# Patient Record
Sex: Female | Born: 1975 | Race: Black or African American | Hispanic: No | Marital: Married | Smoking: Never smoker
Health system: Southern US, Community
[De-identification: ages and names within clinical notes are randomized; demographics above are authoritative.]

## PROBLEM LIST (undated history)

## (undated) DIAGNOSIS — R0602 Shortness of breath: Secondary | ICD-10-CM

## (undated) DIAGNOSIS — R7303 Prediabetes: Secondary | ICD-10-CM

## (undated) DIAGNOSIS — N920 Excessive and frequent menstruation with regular cycle: Secondary | ICD-10-CM

## (undated) DIAGNOSIS — E669 Obesity, unspecified: Secondary | ICD-10-CM

## (undated) DIAGNOSIS — S52502A Unspecified fracture of the lower end of left radius, initial encounter for closed fracture: Secondary | ICD-10-CM

## (undated) DIAGNOSIS — F419 Anxiety disorder, unspecified: Secondary | ICD-10-CM

## (undated) DIAGNOSIS — D649 Anemia, unspecified: Secondary | ICD-10-CM

## (undated) DIAGNOSIS — G4730 Sleep apnea, unspecified: Secondary | ICD-10-CM

## (undated) DIAGNOSIS — E785 Hyperlipidemia, unspecified: Secondary | ICD-10-CM

## (undated) DIAGNOSIS — E0590 Thyrotoxicosis, unspecified without thyrotoxic crisis or storm: Secondary | ICD-10-CM

## (undated) DIAGNOSIS — J3089 Other allergic rhinitis: Secondary | ICD-10-CM

## (undated) HISTORY — DX: Hyperlipidemia, unspecified: E78.5

## (undated) HISTORY — DX: Anemia, unspecified: D64.9

## (undated) HISTORY — DX: Prediabetes: R73.03

## (undated) HISTORY — DX: Other allergic rhinitis: J30.89

## (undated) HISTORY — DX: Obesity, unspecified: E66.9

## (undated) HISTORY — DX: Thyrotoxicosis, unspecified without thyrotoxic crisis or storm: E05.90

## (undated) HISTORY — DX: Excessive and frequent menstruation with regular cycle: N92.0

---

## 2003-06-29 HISTORY — PX: TUBAL LIGATION: SHX77

## 2003-07-13 ENCOUNTER — Inpatient Hospital Stay (HOSPITAL_COMMUNITY): Admission: AD | Admit: 2003-07-13 | Discharge: 2003-07-13 | Payer: Self-pay | Admitting: *Deleted

## 2003-09-09 ENCOUNTER — Ambulatory Visit (HOSPITAL_COMMUNITY): Admission: RE | Admit: 2003-09-09 | Discharge: 2003-09-09 | Payer: Self-pay | Admitting: Obstetrics and Gynecology

## 2004-01-06 ENCOUNTER — Ambulatory Visit (HOSPITAL_COMMUNITY): Admission: AD | Admit: 2004-01-06 | Discharge: 2004-01-06 | Payer: Self-pay | Admitting: Obstetrics and Gynecology

## 2004-01-20 ENCOUNTER — Ambulatory Visit (HOSPITAL_COMMUNITY): Admission: AD | Admit: 2004-01-20 | Discharge: 2004-01-20 | Payer: Self-pay | Admitting: Obstetrics and Gynecology

## 2004-01-22 ENCOUNTER — Inpatient Hospital Stay (HOSPITAL_COMMUNITY): Admission: RE | Admit: 2004-01-22 | Discharge: 2004-01-24 | Payer: Self-pay | Admitting: Obstetrics and Gynecology

## 2005-11-26 ENCOUNTER — Emergency Department (HOSPITAL_COMMUNITY): Admission: EM | Admit: 2005-11-26 | Discharge: 2005-11-26 | Payer: Self-pay | Admitting: Emergency Medicine

## 2007-08-22 ENCOUNTER — Ambulatory Visit: Payer: Self-pay | Admitting: Family Medicine

## 2007-08-25 ENCOUNTER — Ambulatory Visit (HOSPITAL_COMMUNITY): Admission: RE | Admit: 2007-08-25 | Discharge: 2007-08-25 | Payer: Self-pay | Admitting: Family Medicine

## 2007-09-08 ENCOUNTER — Other Ambulatory Visit: Admission: RE | Admit: 2007-09-08 | Discharge: 2007-09-08 | Payer: Self-pay | Admitting: Obstetrics and Gynecology

## 2007-11-10 ENCOUNTER — Ambulatory Visit: Payer: Self-pay | Admitting: Family Medicine

## 2007-11-14 DIAGNOSIS — D509 Iron deficiency anemia, unspecified: Secondary | ICD-10-CM

## 2007-11-14 DIAGNOSIS — E785 Hyperlipidemia, unspecified: Secondary | ICD-10-CM

## 2008-01-12 ENCOUNTER — Ambulatory Visit: Payer: Self-pay | Admitting: Family Medicine

## 2008-04-08 ENCOUNTER — Encounter: Payer: Self-pay | Admitting: Family Medicine

## 2008-04-23 ENCOUNTER — Telehealth: Payer: Self-pay | Admitting: Family Medicine

## 2008-11-13 ENCOUNTER — Ambulatory Visit: Payer: Self-pay | Admitting: Family Medicine

## 2008-11-15 ENCOUNTER — Encounter: Payer: Self-pay | Admitting: Family Medicine

## 2009-11-07 ENCOUNTER — Emergency Department (HOSPITAL_COMMUNITY): Admission: EM | Admit: 2009-11-07 | Discharge: 2009-11-07 | Payer: Self-pay | Admitting: Emergency Medicine

## 2010-07-06 ENCOUNTER — Ambulatory Visit
Admission: RE | Admit: 2010-07-06 | Discharge: 2010-07-06 | Payer: Self-pay | Source: Home / Self Care | Attending: Family Medicine | Admitting: Family Medicine

## 2010-07-06 ENCOUNTER — Encounter: Payer: Self-pay | Admitting: Family Medicine

## 2010-07-06 DIAGNOSIS — F411 Generalized anxiety disorder: Secondary | ICD-10-CM | POA: Insufficient documentation

## 2010-07-30 NOTE — Assessment & Plan Note (Signed)
Summary: ov   Vital Signs:  Patient profile:   35 year old female Menstrual status:  irregular Height:      66 inches Weight:      260.50 pounds BMI:     42.20 O2 Sat:      97 % Pulse rate:   124 / minute Pulse rhythm:   regular Resp:     16 per minute BP sitting:   118 / 80  (left arm) Cuff size:   large  Vitals Entered By: Everitt Amber LPN (July 06, 2010 8:40 AM)  Nutrition Counseling: Patient's BMI is greater than 25 and therefore counseled on weight management options. CC: Follow up visit, has been feeling anxious   CC:  Follow up visit and has been feeling anxious.  History of Present Illness: Reports  that she has been doing well.She is concernedabout weight gain. She also has concerns she may be diabetic.  She wants to resume an apetite suppresant,  and plans to start regular exercise also. Denies recent fever or chills. Denies sinus pressure, nasal congestion , ear pain or sore throat. Denies chest congestion, or cough productive of sputum. Denies chest pain, palpitations, PND, orthopnea or leg swelling. Denies abdominal pain, nausea, vomitting, diarrhea or constipation. Denies change in bowel movements or bloody stool. Denies dysuria , frequency, incontinence or hesitancy. Denies  joint pain, swelling, or reduced mobility. Denies headaches, vertigo, seizures. Denies depression,  or insomnia. Denies  rash, lesions, or itch.     Current Medications (verified): 1)  None  Allergies (verified): 1)  ! Sulfa 2)  ! Flagyl (Metronidazole)  Review of Systems      See HPI General:  Complains of fatigue. Eyes:  Denies blurring and discharge. Psych:  Complains of anxiety. Endo:  Denies excessive hunger and excessive thirst. Heme:  Denies abnormal bruising and bleeding. Allergy:  Denies hives or rash and itching eyes.  Physical Exam  General:  Well-developed,obese iin no acute distress; alert,appropriate and cooperative throughout examination HEENT: No facial  asymmetry,  EOMI, No sinus tenderness, TM's Clear, oropharynx  pink and moist.   Chest: Clear to auscultation bilaterally.  CVS: S1, S2, No murmurs, No S3.   Abd: Soft, Nontender.  MS: Adequate ROM spine, hips, shoulders and knees.  Ext: No edema.   CNS: CN 2-12 intact, power tone and sensation normal throughout.   Skin: Intact, no visible lesions or rashes.  Psych: Good eye contact, normal affect.  Memory intact, not anxious or depressed appearing.    Impression & Recommendations:  Problem # 1:  OBESITY (ICD-278.00) Assessment Deteriorated  Orders: T- Hemoglobin A1C (11914-78295)  Ht: 66 (07/06/2010)   Wt: 260.50 (07/06/2010)   BMI: 42.20 (07/06/2010) therapeutic lifestyle change discussed and encouraged Pt to start phentwermine also  Problem # 2:  ANXIETY STATE, UNSPECIFIED (ICD-300.00) Assessment: Improved reviewed behavioral modification to address anxiety  Complete Medication List: 1)  Phentermine Hcl 37.5 Mg Tabs (Phentermine hcl) .... Take 1 tablet by mouth once a day  Patient Instructions: 1)  Please schedule a follow-up appointment in 2 months. 2)  It is important that you exercise regularly at least 30 minutes 5 times a week. If you develop chest pain, have severe difficulty breathing, or feel very tired , stop exercising immediately and seek medical attention. 3)  You need to lose weight. Consider a lower calorie diet and regular exercise.  4)  HbgA1C prior to visit, ICD-9:  today Prescriptions: PHENTERMINE HCL 37.5 MG TABS (PHENTERMINE HCL) Take 1  tablet by mouth once a day  #30 x 1   Entered and Authorized by:   Syliva Overman MD   Signed by:   Syliva Overman MD on 07/06/2010   Method used:   Printed then faxed to ...       [facility] [address] [facility] (retail)       [address]       [place], [place]  [address]       Ph: [phone]       Fax: [phone]   RxID:   [id number]    Orders Added: 1)  Est. Patient Level IV [40102] 2)   T- Hemoglobin A1C [83036-23375]

## 2010-09-02 ENCOUNTER — Telehealth: Payer: Self-pay | Admitting: Family Medicine

## 2010-09-04 ENCOUNTER — Ambulatory Visit: Payer: Self-pay | Admitting: Family Medicine

## 2010-09-07 ENCOUNTER — Telehealth: Payer: Self-pay | Admitting: Family Medicine

## 2010-09-08 NOTE — Progress Notes (Signed)
Summary: speak with nuse  Phone Note Call from Patient   Summary of Call: needs refill on phentermine. but pt can't come in tomorrow. it will be april [phone] Initial call taken by: Rudene Anda,  September 02, 2010 4:43 PM  Follow-up for Phone Call        PLS ASK IF SHE IS LOSING WEIGHT, LET HER UNDERSTAND CLEARLY SHE NEEDS TO LOSE WEIGHT AND KEEP APRIL APPT, REFILL X 1 PLS  Follow-up by: Syliva Overman MD,  September 02, 2010 4:45 PM  Additional Follow-up for Phone Call Additional follow up Details #1::        returned call, left message Additional Follow-up by: Adella Hare LPN,  September 02, 2010 4:48 PM    Additional Follow-up for Phone Call Additional follow up Details #2::    called patient, left message Follow-up by: Adella Hare LPN,  September 03, 2010 9:56 AM

## 2010-09-15 LAB — URINALYSIS, ROUTINE W REFLEX MICROSCOPIC
Bilirubin Urine: NEGATIVE
Glucose, UA: NEGATIVE mg/dL
Ketones, ur: NEGATIVE mg/dL
Specific Gravity, Urine: 1.015 (ref 1.005–1.030)
pH: 5.5 (ref 5.0–8.0)

## 2010-09-15 LAB — URINE CULTURE

## 2010-09-15 LAB — URINE MICROSCOPIC-ADD ON

## 2010-09-15 NOTE — Progress Notes (Signed)
Summary: medicine  Phone Note Call from Patient   Summary of Call: patient wants you fill her diet pill today please Initial call taken by: Lind Guest,  September 07, 2010 1:14 PM  Follow-up for Phone Call        patient states she is losing but very little and agrees to keep appt next month Follow-up by: Adella Hare LPN,  September 07, 2010 1:25 PM    Prescriptions: PHENTERMINE HCL 37.5 MG TABS (PHENTERMINE HCL) Take 1 tablet by mouth once a day  #30 x 0   Entered by:   Adella Hare LPN   Authorized by:   Syliva Overman MD   Signed by:   Adella Hare LPN on 40/34/7425   Method used:   Printed then faxed to ...       [facility] [facility] (retail)       [address]       [place], [place]  [address]       Ph: [phone]       Fax: [phone]   RxID:   [id number]

## 2010-10-15 ENCOUNTER — Encounter: Payer: Self-pay | Admitting: Family Medicine

## 2010-10-16 ENCOUNTER — Encounter: Payer: Self-pay | Admitting: Family Medicine

## 2010-10-19 ENCOUNTER — Encounter: Payer: Self-pay | Admitting: Family Medicine

## 2010-10-19 ENCOUNTER — Ambulatory Visit (INDEPENDENT_AMBULATORY_CARE_PROVIDER_SITE_OTHER): Payer: PRIVATE HEALTH INSURANCE | Admitting: Family Medicine

## 2010-10-19 VITALS — BP 124/74 | HR 103 | Resp 16 | Ht 65.0 in | Wt 262.8 lb

## 2010-10-19 DIAGNOSIS — R7301 Impaired fasting glucose: Secondary | ICD-10-CM

## 2010-10-19 DIAGNOSIS — E785 Hyperlipidemia, unspecified: Secondary | ICD-10-CM

## 2010-10-19 DIAGNOSIS — R5381 Other malaise: Secondary | ICD-10-CM

## 2010-10-19 DIAGNOSIS — D649 Anemia, unspecified: Secondary | ICD-10-CM

## 2010-10-19 DIAGNOSIS — E669 Obesity, unspecified: Secondary | ICD-10-CM

## 2010-10-19 DIAGNOSIS — F411 Generalized anxiety disorder: Secondary | ICD-10-CM

## 2010-10-19 DIAGNOSIS — J309 Allergic rhinitis, unspecified: Secondary | ICD-10-CM

## 2010-10-19 DIAGNOSIS — R5383 Other fatigue: Secondary | ICD-10-CM

## 2010-10-19 MED ORDER — PHENTERMINE HCL 37.5 MG PO TABS: 37.5000 mg | ORAL_TABLET | Freq: Every day | ORAL | Status: DC

## 2010-10-19 MED ORDER — MONTELUKAST SODIUM 10 MG PO TABS: 10.0000 mg | ORAL_TABLET | Freq: Every day | ORAL | Status: DC

## 2010-10-19 NOTE — Patient Instructions (Signed)
F/u in 4 months.  It is important that you exercise regularly at least 30 minutes 5 times a week. If you develop chest pain, have severe difficulty breathing, or feel very tired, stop exercising immediately and seek medical attention    A healthy diet is rich in fruit, vegetables and whole grains. Poultry fish, nuts and beans are a healthy choice for protein rather then red meat. A low sodium diet and drinking 64 ounces of water daily is generally recommended. Oils and sweet should be limited. Carbohydrates especially for those who are diabetic or overweight, should be limited to 34-45 gram per meal. It is important to eat on a regular schedule, at least 3 times daily. Snacks should be primarily fruits, vegetables or nuts.   Fasting lipid, blood sugar and HBA1c also iron and HB as soon as possible  You will get a 1500 cal diet sheet. One multivitamin  Once daily

## 2010-10-20 ENCOUNTER — Telehealth: Payer: Self-pay | Admitting: Family Medicine

## 2010-10-30 ENCOUNTER — Other Ambulatory Visit: Payer: Self-pay | Admitting: Family Medicine

## 2010-10-30 LAB — BASIC METABOLIC PANEL
BUN: 14 mg/dL (ref 6–23)
Chloride: 102 mEq/L (ref 96–112)
Potassium: 4.1 mEq/L (ref 3.5–5.3)
Sodium: 137 mEq/L (ref 135–145)

## 2010-10-30 LAB — LIPID PANEL
HDL: 40 mg/dL (ref >39)
Total CHOL/HDL Ratio: 3.9 Ratio
Triglycerides: 49 mg/dL (ref <150)

## 2010-10-30 LAB — TSH: TSH: 0.76 u[IU]/mL (ref 0.350–4.500)

## 2010-10-30 LAB — IRON: Iron: 53 ug/dL (ref 42–145)

## 2010-10-30 LAB — GLUCOSE, RANDOM: Glucose, Bld: 100 mg/dL — ABNORMAL HIGH (ref 70–99)

## 2010-11-02 ENCOUNTER — Telehealth: Payer: Self-pay | Admitting: Family Medicine

## 2010-11-02 MED ORDER — METFORMIN HCL 500 MG PO TABS: 500.0000 mg | ORAL_TABLET | Freq: Two times a day (BID) | ORAL | Status: DC

## 2010-11-02 NOTE — Telephone Encounter (Signed)
Spoke with patient.

## 2010-11-02 NOTE — Progress Notes (Signed)
Patient aware and coming this week for diabetic teaching

## 2010-11-04 NOTE — Assessment & Plan Note (Signed)
Deteriorated. Patient re-educated about  the importance of commitment to a  minimum of 150 minutes of exercise per week. The importance of healthy food choices with portion control discussed. Encouraged to start a food diary, count calories and to consider  joining a support group. Sample diet sheets offered. Goals set by the patient for the next several months.    

## 2010-11-04 NOTE — Progress Notes (Signed)
  Subjective:    Patient ID: Doris Romero, female    DOB: 1976-01-17, 35 y.o.   MRN: [id number]  HPI Pt  In with a primary c/o excessive weight gain requesting assistance with phentermine, which has  Helped in the past. She had an elevated HBA1c several months ago, and has diabetics in her family. She denies excessive thirst, polyuria, blurred vision or vision changes. She reports excessive stress primarily from her clases, does not want medication, and plans to become more organized.    Review of Systems Denies recent fever or chills. Denies sinus pressure, nasal congestion, ear pain or sore throat. Denies chest congestion, productive cough or wheezing. Denies chest pains, palpitations, paroxysmal nocturnal dyspnea, orthopnea and leg swelling Denies abdominal pain, nausea, vomiting,diarrhea or constipation.  t. Denies dysuria, frequency, hesitancy or incontinence. Denies joint pain, swelling and limitation in mobility. Denies skin break down or rash.        Objective:   Physical Exam Patient alert and oriented and in no Cardiopulmonary distress.  HEENT: No facial asymmetry, EOMI, no sinus tenderness, .  Neck supple no adenopathy.  Chest: Clear to auscultation bilaterally.  CVS: S1, S2 no murmurs, no S3.  ABD: Soft non tender. Bowel sounds normal.  Ext: No edema  MS: Adequate ROM spine, shoulders, hips and knees.  Skin: Intact, no ulcerations or rash noted.  Psych: Good eye contact, normal affect. Memory intact not anxious or depressed appearing.  CNS: CN 2-12 intact, power, tone and sensation normal throughout.        Assessment & Plan:

## 2010-11-04 NOTE — Assessment & Plan Note (Signed)
Pt ventilated for approx 7 minutes, coping strategies for anxiety discussed

## 2010-11-13 NOTE — Op Note (Signed)
NAME:  Doris Romero, Doris Romero NO.:  [id number]   MEDICAL RECORD NO.:  [id number]                   PATIENT TYPE:  INP   LOCATION:  A417                                 FACILITY:  [facility]   PHYSICIAN:  Tilda Burrow, M.D.              DATE OF BIRTH:  13-Feb-1976   DATE OF PROCEDURE:  DATE OF DISCHARGE:                                 OPERATIVE REPORT   PREOPERATIVE DIAGNOSES:  1. Pregnancy at 39 weeks.  2. Repeat cesarean section, not for trial of labor.  3. Desire for elective permanent sterilization.  4. Unsatisfactory old cesarean section scar.   POSTOPERATIVE DIAGNOSES:  1. Pregnancy at 39 weeks.  2. Repeat cesarean section, not for trial of labor.  3. Desire for elective permanent sterilization.  4. Unsatisfactory old cesarean section scar.   PROCEDURE:  Repeat cesarean section, bilateral tubal ligation, wide excision  of cicatrix.   SURGEON:  Tilda Burrow, M.D.   ASSISTANT:  Cecile Sheerer, R.N.   ANESTHESIA:  Spinal.  Glynn Octave, C.R.N.A.   COMPLICATIONS:  None.   FINDINGS:  8 pound 1.5 ounce female infant.  Apgars 9 and 9.  Clear amniotic  fluid.  Thin lower uterine segment.  Normal-appearing tubes bilaterally.   DESCRIPTION OF PROCEDURE:  The patient was taken to the operating room and  prepped and draped in the usual fashion for lower abdominal surgery.   The Pfannenstiel-type incision was widely excised, removing the old cicatrix  with approximately 10 to 12 cm of skin and underlying fatty tissue.  The  fascia was opened in the midline.  There was a lot of fibrosis in this area.  The muscles could be easily peeled off of the underside of the fascia  despite the tendency towards adhesions.  Midline entry of the peritoneal  cavity was without difficulty, and the bladder flap was inspected.  She had  a very low bladder location on the uterus, so we did not have to perform  bladder flap.  A transverse uterine incision was made through  the thin lower  uterine segment and extended laterally using index finger traction.  The  amniotic fluid was clear.  A loop of cord protruded through the incision,  slipping passed the head immediately.  The head was guided into the incision  and fundal pressure applied while the vertex was guided through the incision  manually.  The patient tolerated the fundal pressure well.  We were able to  deliver the infant, clamp the cord, and pass the infant to the awaiting  pediatrician, Dr. Francoise Schaumann. Halm.  Apgars of 9 and 9 were assigned.  Weight  was subsequently reported as 8 pounds 1.5 ounces.  See his notes for further  details on the baby.   Cord blood gas was obtained, and the results are pending elsewhere.  The  placenta delivered intact, Schultze presentation, with no membrane remnants  identifiable.  Uterine  irrigation with antibiotic-containing solution was  followed by a single layer of running locking closure of the uterine  incision, with successful closure with good hemostasis.   Tubal ligation was performed by identifying each tube to its fimbriated end,  elevating a midsegment knuckle of tube, doubly ligating around the  incarcerated knuckle of tube, and then excising the incarcerated specimen.  These were sent to pathology.   The peritoneal cavity was closed in the midline with 2-0 chromic.  The  fascia was then closed transversely with 0 Vicryl after the rectus muscles  had been pulled into the midline.   Prior to the fascial closure, we trimmed an approximately 1-cm portion of  the lax fascial tissue to result in better tissue edge approximation without  overlap.  We also trimmed an additional 3 cm of skin so that the skin edge  approximations were optimized.  The subcutaneous tissues were reapproximated  with interrupted 2-0 plain.  A JP drain was placed and allowed to exit  through a separate stab incision on the left side.  Staple closure of the  skin completed the  procedure.  Estimated blood loss was 600 cc, and there  was satisfactory cosmetic result.   The patient went to the recovery room in good condition.      ___________________________________________                                            Tilda Burrow, M.D.   JVF/MEDQ  D:  01/22/2004  T:  01/22/2004  Job:  [id number]   cc:   Francoise Schaumann. Halm, D.O.  [address], [address]  [place]  [place] [address]  Fax: [phone]

## 2010-11-13 NOTE — Discharge Summary (Signed)
NAME:  Doris Romero, Doris Romero NO.:  [id number]   MEDICAL RECORD NO.:  [id number]                   PATIENT TYPE:  INP   LOCATION:  A417                                 FACILITY:  [facility]   PHYSICIAN:  Tilda Burrow, M.D.              DATE OF BIRTH:  [birth year]   DATE OF ADMISSION:  01/22/2004  DATE OF DISCHARGE:  01/24/2004                                 DISCHARGE SUMMARY   ADMISSION DIAGNOSES:  1. Pregnancy, [redacted] weeks gestation.  2. Prior Cesarean section, not for trial of labor.  3. Elective permanent sterilization.   DISCHARGE DIAGNOSES:  1. Pregnancy, 39 weeks, delivered.  2. Elective sterilization.   PROCEDURE:  On 01/22/2004, repeat low transverse cervical Cesarean section  and bilateral partial salpingectomy.   DISCHARGE MEDICATIONS:  1. Darvocet-N 100, two q.4h. p.r.n. pain.  2. __________ Larose Hires one p.o. daily x30 days.   FOLLOW UP:  In one week for staple removal.   HOSPITAL COURSE:  This 35 year old gravida 5, para 1, AB 3 at 19 weeks was  admitted for repeat Cesarean section and tubal ligation after pregnancy  followed through [facility] and documented in the prenatal records  and __________ history.  The patient underwent cesarean section, delivering  a female infant with Apgars of 9 and 9 with 600 cc estimated blood loss.  Tubal ligation was performed as well as wide excision of cicatrix.  The  patient's postoperative course was uneventful, with minimal pain  experienced.  Hemoglobin 11, hematocrit 32 postoperatively.  The patient was  discharged on postoperative day #2, for follow up in one week for staple  removal and four weeks for incision check.     ___________________________________________                                         Tilda Burrow, M.D.   JVF/MEDQ  D:  02/20/2004  T:  02/21/2004  Job:  [id number]

## 2010-11-13 NOTE — H&P (Signed)
NAME:  Doris Romero, Doris Romero NO.:  [id number]   MEDICAL RECORD NO.:  [id number]                   PATIENT TYPE:  INP   LOCATION:  A417                                 FACILITY:  [facility]   PHYSICIAN:  Tilda Burrow, M.D.              DATE OF BIRTH:  Nov 14, 1975   DATE OF ADMISSION:  01/22/2004  DATE OF DISCHARGE:                                HISTORY & PHYSICAL   Doris Romero is a 35 year old, gravida 5, para 1, AB 3, at 39 weeks by consensus  criteria, admitted for repeat Cesarean section and tubal ligation.  Her  prenatal course has been uncomplicated through our office.  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  She is currently having no symptoms.  She, additionally, has requested  elective permanent sterilization with tubal ligation planned with general  failure rates of 1/100 known to patient.   PAST MEDICAL HISTORY:  Benign.   PAST SURGICAL HISTORY:  C-section times one.   ALLERGIES:  ANTIBIOTICS, only.  See hand-written HPI.   PHYSICAL EXAMINATION:  GENERAL:  This is a large-framed, African-American  female with a term sized fetus, vertex presentation, with exam specific for  deep crease at the site of the old C-section scar, which the patient desires  to have excised at the time of the C-section.  We plan a wide excision of  cicatrix to improve cosmetic appearance, as per patient request.   See prenatal record for labs.   PLAN:  Repeat Cesarean section, tubal ligation on January 22, 2004.     ___________________________________________                                         Tilda Burrow, M.D.   JVF/MEDQ  D:  01/22/2004  T:  01/22/2004  Job:  [id number]

## 2011-01-26 ENCOUNTER — Ambulatory Visit: Payer: PRIVATE HEALTH INSURANCE | Admitting: Family Medicine

## 2011-09-06 ENCOUNTER — Ambulatory Visit (INDEPENDENT_AMBULATORY_CARE_PROVIDER_SITE_OTHER): Payer: PRIVATE HEALTH INSURANCE | Admitting: Family Medicine

## 2011-09-06 ENCOUNTER — Encounter: Payer: Self-pay | Admitting: Family Medicine

## 2011-09-06 ENCOUNTER — Telehealth: Payer: Self-pay | Admitting: Family Medicine

## 2011-09-06 VITALS — BP 110/70 | HR 95 | Resp 16 | Ht 65.0 in | Wt 255.1 lb

## 2011-09-06 DIAGNOSIS — M25551 Pain in right hip: Secondary | ICD-10-CM | POA: Insufficient documentation

## 2011-09-06 DIAGNOSIS — M25519 Pain in unspecified shoulder: Secondary | ICD-10-CM

## 2011-09-06 DIAGNOSIS — F411 Generalized anxiety disorder: Secondary | ICD-10-CM

## 2011-09-06 DIAGNOSIS — R7309 Other abnormal glucose: Secondary | ICD-10-CM

## 2011-09-06 DIAGNOSIS — M25552 Pain in left hip: Secondary | ICD-10-CM

## 2011-09-06 DIAGNOSIS — M25512 Pain in left shoulder: Secondary | ICD-10-CM

## 2011-09-06 DIAGNOSIS — R7302 Impaired glucose tolerance (oral): Secondary | ICD-10-CM

## 2011-09-06 DIAGNOSIS — M25559 Pain in unspecified hip: Secondary | ICD-10-CM

## 2011-09-06 DIAGNOSIS — E669 Obesity, unspecified: Secondary | ICD-10-CM

## 2011-09-06 DIAGNOSIS — Z862 Personal history of diseases of the blood and blood-forming organs and certain disorders involving the immune mechanism: Secondary | ICD-10-CM

## 2011-09-06 MED ORDER — PHENTERMINE HCL 37.5 MG PO TBDP: 37.5000 mg | ORAL_TABLET | Freq: Every day | ORAL | Status: DC

## 2011-09-06 NOTE — Assessment & Plan Note (Signed)
1 year h/o bilateral shoulder pain waking pt

## 2011-09-06 NOTE — Patient Instructions (Addendum)
F/u in 2 month.  You are referred to Dr Dion Saucier about progressive shoulder and hip pain.  HBA1C ,chem7, cbc and iron today please  It is important that you exercise regularly at least 30 minutes 5 times a week. If you develop chest pain, have severe difficulty breathing, or feel very tired, stop exercising immediately and seek medical attention    A healthy diet is rich in fruit, vegetables and whole grains. Poultry fish, nuts and beans are a healthy choice for protein rather then red meat. A low sodium diet and drinking 64 ounces of water daily is generally recommended. Oils and sweet should be limited. Carbohydrates especially for those who are diabetic or overweight, should be limited to 30-45 gram per meal. It is important to eat on a regular schedule, at least 3 times daily. Snacks should be primarily fruits, vegetables or nuts.

## 2011-09-06 NOTE — Telephone Encounter (Signed)
Faxed in to [facility]

## 2011-09-06 NOTE — Assessment & Plan Note (Signed)
3 month h/o bilateral hip pai n which is worsening

## 2011-09-06 NOTE — Assessment & Plan Note (Signed)
Deteriorated. Patient re-educated about  the importance of commitment to a  minimum of 150 minutes of exercise per week. The importance of healthy food choices with portion control discussed. Encouraged to start a food diary, count calories and to consider  joining a support group. Sample diet sheets offered. Goals set by the patient for the next several months.    

## 2011-09-07 LAB — CBC WITH DIFFERENTIAL/PLATELET
Basophils Relative: 0 % (ref 0–1)
Eosinophils Absolute: 0.1 10*3/uL (ref 0.0–0.7)
Eosinophils Relative: 4 % (ref 0–5)
HCT: 37.2 % (ref 36.0–46.0)
Hemoglobin: 11.5 g/dL — ABNORMAL LOW (ref 12.0–15.0)
Lymphs Abs: 1.7 10*3/uL (ref 0.7–4.0)
MCH: 24.8 pg — ABNORMAL LOW (ref 26.0–34.0)
MCHC: 30.9 g/dL (ref 30.0–36.0)
MCV: 80.3 fL (ref 78.0–100.0)
Monocytes Absolute: 0.5 10*3/uL (ref 0.1–1.0)
Monocytes Relative: 13 % — ABNORMAL HIGH (ref 3–12)
Neutrophils Relative %: 38 % — ABNORMAL LOW (ref 43–77)
RBC: 4.63 MIL/uL (ref 3.87–5.11)

## 2011-09-07 LAB — BASIC METABOLIC PANEL
CO2: 25 mEq/L (ref 19–32)
Chloride: 102 mEq/L (ref 96–112)
Creat: 0.49 mg/dL — ABNORMAL LOW (ref 0.50–1.10)
Potassium: 4.2 mEq/L (ref 3.5–5.3)
Sodium: 136 mEq/L (ref 135–145)

## 2011-09-07 LAB — HEMOGLOBIN A1C
Hgb A1c MFr Bld: 6.3 % — ABNORMAL HIGH (ref <5.7)
Mean Plasma Glucose: 134 mg/dL — ABNORMAL HIGH (ref <117)

## 2011-09-07 LAB — IRON: Iron: 88 ug/dL (ref 42–145)

## 2011-09-10 ENCOUNTER — Telehealth: Payer: Self-pay | Admitting: Family Medicine

## 2011-09-14 NOTE — Telephone Encounter (Signed)
Called patient and left message for them to return call at the office   

## 2011-09-15 DIAGNOSIS — R7302 Impaired glucose tolerance (oral): Secondary | ICD-10-CM | POA: Insufficient documentation

## 2011-09-15 NOTE — Assessment & Plan Note (Signed)
ELevated HBA1c last year, pt will have this repeated, metformin a good drug for her , however will need to readdress this at f/u

## 2011-09-15 NOTE — Assessment & Plan Note (Signed)
Increased due to multiple personal stressors

## 2011-09-15 NOTE — Telephone Encounter (Signed)
Pt aware.

## 2011-09-15 NOTE — Progress Notes (Signed)
  Subjective:    Patient ID: Doris Romero, female    DOB: 1975-10-30, 36 y.o.   MRN: [id number]  HPI The PT is here for follow up and re-evaluation of chronic medical conditions, medication management and review of any available recent lab and radiology data.  C/o progressive and disabling bilateral hip pain, right greater than left, sometimes disturbing her sleep, also bilateral shoulder pain, both have progressed over the last 3 to 4 months. No fall or trauma history. 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 Has iron deficiency and wants this checked also    Review of Systems See HPI Denies recent fever or chills. Denies sinus pressure, nasal congestion, ear pain or sore throat. Denies chest congestion, productive cough or wheezing. Denies chest pains, palpitations and leg swelling Denies abdominal pain, nausea, vomiting,diarrhea or constipation.   Denies dysuria, frequency, hesitancy or incontinence.  Denies headaches, seizures, numbness, or tingling.  Denies skin break down or rash.        Objective:   Physical Exam Patient alert and oriented and in no cardiopulmonary distress.  HEENT: No facial asymmetry, EOMI, no sinus tenderness,  oropharynx pink and moist.  Neck supple no adenopathy.  Chest: Clear to auscultation bilaterally.  CVS: S1, S2 no murmurs, no S3.  ABD: Soft non tender. Bowel sounds normal.  Ext: No edema  MS: Adequate ROM spine,  and knees.Mildly decreased in hips and shoulders  Skin: Intact, no ulcerations or rash noted.  Psych: Good eye contact, normal affect. Memory intact anxious  not depressed appearing.  CNS: CN 2-12 intact, power, tone and sensation normal throughout.          Assessment & Plan:

## 2011-09-15 NOTE — Assessment & Plan Note (Signed)
reases with updated labwork

## 2012-06-05 ENCOUNTER — Encounter: Payer: Self-pay | Admitting: Family Medicine

## 2012-06-05 ENCOUNTER — Ambulatory Visit (INDEPENDENT_AMBULATORY_CARE_PROVIDER_SITE_OTHER): Payer: BC Managed Care – PPO | Admitting: Family Medicine

## 2012-06-05 ENCOUNTER — Other Ambulatory Visit: Payer: Self-pay | Admitting: Family Medicine

## 2012-06-05 VITALS — BP 130/74 | HR 104 | Resp 18 | Ht 65.0 in | Wt 240.0 lb

## 2012-06-05 DIAGNOSIS — E785 Hyperlipidemia, unspecified: Secondary | ICD-10-CM

## 2012-06-05 DIAGNOSIS — R7309 Other abnormal glucose: Secondary | ICD-10-CM

## 2012-06-05 DIAGNOSIS — E669 Obesity, unspecified: Secondary | ICD-10-CM

## 2012-06-05 DIAGNOSIS — E0590 Thyrotoxicosis, unspecified without thyrotoxic crisis or storm: Secondary | ICD-10-CM

## 2012-06-05 DIAGNOSIS — R7302 Impaired glucose tolerance (oral): Secondary | ICD-10-CM

## 2012-06-05 MED ORDER — PHENTERMINE HCL 37.5 MG PO TBDP: 1.0000 | ORAL_TABLET | Freq: Every day | ORAL | Status: DC

## 2012-06-05 NOTE — Patient Instructions (Addendum)
F/u in 4 month  Weight loss goal of 4 to 5 pounds per month  HBA1C, chem 7, TSH, cbc today  CONGRATS on weight ,loss, keep it up.  Start HALF phentermine tablet daily (script says one)  Please start daily, physical activity  For at least 30 minutes for health, keep dancing   CONGRATS on copmpleting your course

## 2012-06-06 LAB — BASIC METABOLIC PANEL
BUN: 11 mg/dL (ref 6–23)
CO2: 26 mEq/L (ref 19–32)
Chloride: 105 mEq/L (ref 96–112)
Potassium: 4.1 mEq/L (ref 3.5–5.3)

## 2012-06-06 LAB — CBC
HCT: 32.8 % — ABNORMAL LOW (ref 36.0–46.0)
MCV: 74 fL — ABNORMAL LOW (ref 78.0–100.0)
RBC: 4.43 MIL/uL (ref 3.87–5.11)
RDW: 14.7 % (ref 11.5–15.5)
WBC: 5.2 10*3/uL (ref 4.0–10.5)

## 2012-06-06 LAB — TSH: TSH: <0.008 u[IU]/mL — ABNORMAL LOW (ref 0.350–4.500)

## 2012-06-07 ENCOUNTER — Other Ambulatory Visit: Payer: Self-pay | Admitting: Family Medicine

## 2012-06-07 DIAGNOSIS — E0590 Thyrotoxicosis, unspecified without thyrotoxic crisis or storm: Secondary | ICD-10-CM

## 2012-06-07 DIAGNOSIS — R7989 Other specified abnormal findings of blood chemistry: Secondary | ICD-10-CM

## 2012-06-07 LAB — ANEMIA PANEL
%SAT: 27 % (ref 20–55)
ABS Retic: 40.3 10*3/uL (ref 19.0–186.0)
RBC.: 4.48 MIL/uL (ref 3.87–5.11)
TIBC: 341 ug/dL (ref 250–470)
UIBC: 250 ug/dL (ref 125–400)

## 2012-06-13 ENCOUNTER — Ambulatory Visit (HOSPITAL_COMMUNITY): Payer: BC Managed Care – PPO

## 2012-06-14 ENCOUNTER — Ambulatory Visit (HOSPITAL_COMMUNITY)
Admission: RE | Admit: 2012-06-14 | Discharge: 2012-06-14 | Disposition: A | Discharge status: Home / Self Care | Payer: BC Managed Care – PPO | Source: Ambulatory Visit | Attending: Family Medicine | Admitting: Family Medicine

## 2012-06-14 DIAGNOSIS — R7989 Other specified abnormal findings of blood chemistry: Secondary | ICD-10-CM

## 2012-06-14 DIAGNOSIS — E0590 Thyrotoxicosis, unspecified without thyrotoxic crisis or storm: Secondary | ICD-10-CM

## 2012-06-14 DIAGNOSIS — E041 Nontoxic single thyroid nodule: Secondary | ICD-10-CM | POA: Insufficient documentation

## 2012-06-18 DIAGNOSIS — E890 Postprocedural hypothyroidism: Secondary | ICD-10-CM | POA: Insufficient documentation

## 2012-06-18 NOTE — Assessment & Plan Note (Signed)
Deterioration in TSH value, with elevated free hormone levels. Essentially unintentional weight o, and hyperactivity. Endo referral for further management

## 2012-06-18 NOTE — Assessment & Plan Note (Signed)
Improved. Pt applauded on succesful weight loss through lifestyle change, and encouraged to continue same. Weight loss goal set for the next several months.  

## 2012-06-18 NOTE — Assessment & Plan Note (Signed)
Mildly elevated LDL Hyperlipidemia:Low fat diet discussed and encouraged.  No meds at this visit

## 2012-06-18 NOTE — Progress Notes (Signed)
  Subjective:    Patient ID: Doris Romero, female    DOB: 11-30-1975, 36 y.o.   MRN: [id number]  HPI The PT is here for follow up and re-evaluation of chronic medical conditions, medication management and review of any available recent lab and radiology data. outstanding will be done on day of visit Preventive health is updated, specifically  Cancer screening and Immunization.     Here to have update on labs , and re evaluation of thyroid status as well as prediabetic status. Labs will need to be reviewed after visit as blood drawn on day of visit. Unfortunately , lost her Mom to ill health since last visit.  Now completing RN program, happy and relieved about this Has worked inconsistently, if at all on weight loss and is therefore surprised about same, requesting phentermine for additional help. Will hold on med pending thyroid function result    Review of Systems See HPI Denies recent fever or chills. Denies sinus pressure, nasal congestion, ear pain or sore throat. Denies chest congestion, productive cough or wheezing. Denies chest pains, palpitations and leg swelling Denies abdominal pain, nausea, vomiting,diarrhea or constipation.   Denies dysuria, frequency, hesitancy or incontinence. Denies joint pain, swelling and limitation in mobility. Denies headaches, seizures, numbness, or tingling. Denies depression, anxiety or insomnia. Denies skin break down or rash.        Objective:   Physical Exam  Patient alert and oriented and in no cardiopulmonary distress.  HEENT: No facial asymmetry, EOMI, no sinus tenderness,  oropharynx pink and moist.  Neck supple no adenopathy.  Chest: Clear to auscultation bilaterally.  CVS: S1, S2 no murmurs, no S3.  ABD: Soft non tender. Bowel sounds normal.  Ext: No edema  MS: Adequate ROM spine, shoulders, hips and knees.  Skin: Intact, no ulcerations or rash noted.  Psych: Good eye contact, normal affect. Memory intact not  anxious or depressed appearing.  CNS: CN 2-12 intact, power, tone and sensation normal throughout.       Assessment & Plan:

## 2012-06-18 NOTE — Assessment & Plan Note (Signed)
Mild improvement with weight loss, though still abn Patient educated about the importance of limiting  Carbohydrate intake , the need to commit to daily physical activity for a minimum of 30 minutes , and to commit weight loss. The fact that changes in all these areas will reduce or eliminate all together the development of diabetes is stressed.

## 2012-08-16 ENCOUNTER — Other Ambulatory Visit (HOSPITAL_COMMUNITY): Payer: Self-pay | Admitting: "Endocrinology

## 2012-08-16 DIAGNOSIS — E0590 Thyrotoxicosis, unspecified without thyrotoxic crisis or storm: Secondary | ICD-10-CM

## 2012-08-17 ENCOUNTER — Encounter (HOSPITAL_COMMUNITY)
Admission: RE | Admit: 2012-08-17 | Discharge: 2012-08-17 | Disposition: A | Discharge status: Home / Self Care | Payer: BC Managed Care – PPO | Source: Ambulatory Visit | Attending: "Endocrinology | Admitting: "Endocrinology

## 2012-08-17 ENCOUNTER — Encounter (HOSPITAL_COMMUNITY): Payer: Self-pay

## 2012-08-17 DIAGNOSIS — E0590 Thyrotoxicosis, unspecified without thyrotoxic crisis or storm: Secondary | ICD-10-CM | POA: Insufficient documentation

## 2012-08-17 DIAGNOSIS — E079 Disorder of thyroid, unspecified: Secondary | ICD-10-CM | POA: Insufficient documentation

## 2012-08-17 MED ORDER — SODIUM IODIDE I 131 CAPSULE
15.0000 | Freq: Once | INTRAVENOUS | Status: AC | PRN
  Administered 2012-08-17: 15 via ORAL

## 2012-08-18 ENCOUNTER — Encounter (HOSPITAL_COMMUNITY)
Admission: RE | Admit: 2012-08-18 | Discharge: 2012-08-18 | Disposition: A | Discharge status: Home / Self Care | Payer: BC Managed Care – PPO | Source: Ambulatory Visit | Attending: "Endocrinology | Admitting: "Endocrinology

## 2012-08-18 ENCOUNTER — Encounter (HOSPITAL_COMMUNITY): Payer: Self-pay

## 2012-08-18 MED ORDER — SODIUM PERTECHNETATE TC 99M INJECTION
10.0000 | Freq: Once | INTRAVENOUS | Status: AC | PRN
  Administered 2012-08-18: 10 via INTRAVENOUS

## 2012-08-23 ENCOUNTER — Other Ambulatory Visit (HOSPITAL_COMMUNITY): Payer: Self-pay | Admitting: "Endocrinology

## 2012-08-28 ENCOUNTER — Ambulatory Visit (HOSPITAL_COMMUNITY): Payer: BC Managed Care – PPO

## 2012-08-28 ENCOUNTER — Encounter (HOSPITAL_COMMUNITY): Payer: BC Managed Care – PPO

## 2012-08-29 ENCOUNTER — Encounter (HOSPITAL_COMMUNITY): Payer: Self-pay

## 2012-08-29 ENCOUNTER — Encounter (HOSPITAL_COMMUNITY)
Admission: RE | Admit: 2012-08-29 | Discharge: 2012-08-29 | Disposition: A | Discharge status: Home / Self Care | Payer: BC Managed Care – PPO | Source: Ambulatory Visit | Attending: "Endocrinology | Admitting: "Endocrinology

## 2012-08-29 DIAGNOSIS — E0500 Thyrotoxicosis with diffuse goiter without thyrotoxic crisis or storm: Secondary | ICD-10-CM | POA: Insufficient documentation

## 2012-08-29 MED ORDER — SODIUM IODIDE I 131 CAPSULE
12.0000 | Freq: Once | INTRAVENOUS | Status: AC | PRN
  Administered 2012-08-29: 12 via ORAL

## 2013-09-26 ENCOUNTER — Other Ambulatory Visit: Payer: Self-pay | Admitting: Family Medicine

## 2013-09-26 ENCOUNTER — Encounter: Payer: Self-pay | Admitting: Family Medicine

## 2013-09-26 ENCOUNTER — Ambulatory Visit (INDEPENDENT_AMBULATORY_CARE_PROVIDER_SITE_OTHER): Payer: 59 | Admitting: Family Medicine

## 2013-09-26 ENCOUNTER — Encounter (INDEPENDENT_AMBULATORY_CARE_PROVIDER_SITE_OTHER): Payer: Self-pay

## 2013-09-26 VITALS — BP 120/80 | HR 108 | Resp 16 | Wt 265.0 lb

## 2013-09-26 DIAGNOSIS — E669 Obesity, unspecified: Secondary | ICD-10-CM

## 2013-09-26 DIAGNOSIS — G479 Sleep disorder, unspecified: Secondary | ICD-10-CM

## 2013-09-26 DIAGNOSIS — Z130 Encounter for screening for diseases of the blood and blood-forming organs and certain disorders involving the immune mechanism: Secondary | ICD-10-CM

## 2013-09-26 DIAGNOSIS — R7309 Other abnormal glucose: Secondary | ICD-10-CM

## 2013-09-26 DIAGNOSIS — E785 Hyperlipidemia, unspecified: Secondary | ICD-10-CM

## 2013-09-26 DIAGNOSIS — Z1329 Encounter for screening for other suspected endocrine disorder: Secondary | ICD-10-CM

## 2013-09-26 DIAGNOSIS — E8881 Metabolic syndrome: Secondary | ICD-10-CM

## 2013-09-26 DIAGNOSIS — E0590 Thyrotoxicosis, unspecified without thyrotoxic crisis or storm: Secondary | ICD-10-CM

## 2013-09-26 DIAGNOSIS — Z862 Personal history of diseases of the blood and blood-forming organs and certain disorders involving the immune mechanism: Secondary | ICD-10-CM

## 2013-09-26 DIAGNOSIS — Z13228 Encounter for screening for other metabolic disorders: Secondary | ICD-10-CM

## 2013-09-26 DIAGNOSIS — R7302 Impaired glucose tolerance (oral): Secondary | ICD-10-CM

## 2013-09-26 DIAGNOSIS — Z1321 Encounter for screening for nutritional disorder: Secondary | ICD-10-CM

## 2013-09-26 LAB — HEMOGLOBIN A1C
Hgb A1c MFr Bld: 6.3 % — ABNORMAL HIGH (ref <5.7)
MEAN PLASMA GLUCOSE: 134 mg/dL — AB (ref <117)

## 2013-09-26 LAB — CBC
HEMATOCRIT: 33.5 % — AB (ref 36.0–46.0)
HEMOGLOBIN: 10.8 g/dL — AB (ref 12.0–15.0)
MCH: 24.5 pg — ABNORMAL LOW (ref 26.0–34.0)
MCHC: 32.2 g/dL (ref 30.0–36.0)
MCV: 76.1 fL — ABNORMAL LOW (ref 78.0–100.0)
Platelets: 261 10*3/uL (ref 150–400)
RBC: 4.4 MIL/uL (ref 3.87–5.11)
RDW: 16.5 % — ABNORMAL HIGH (ref 11.5–15.5)
WBC: 5.6 10*3/uL (ref 4.0–10.5)

## 2013-09-26 LAB — COMPREHENSIVE METABOLIC PANEL
ALBUMIN: 4.4 g/dL (ref 3.5–5.2)
ALT: 16 U/L (ref 0–35)
AST: 39 U/L — AB (ref 0–37)
Alkaline Phosphatase: 37 U/L — ABNORMAL LOW (ref 39–117)
BUN: 16 mg/dL (ref 6–23)
CALCIUM: 8.9 mg/dL (ref 8.4–10.5)
CHLORIDE: 100 meq/L (ref 96–112)
CO2: 27 meq/L (ref 19–32)
Creat: 1.17 mg/dL — ABNORMAL HIGH (ref 0.50–1.10)
GLUCOSE: 94 mg/dL (ref 70–99)
POTASSIUM: 3.6 meq/L (ref 3.5–5.3)
Sodium: 136 mEq/L (ref 135–145)
Total Bilirubin: 0.5 mg/dL (ref 0.2–1.2)
Total Protein: 8.1 g/dL (ref 6.0–8.3)

## 2013-09-26 LAB — LIPID PANEL
Cholesterol: 261 mg/dL — ABNORMAL HIGH (ref 0–200)
HDL: 60 mg/dL (ref >39)
LDL Cholesterol: 186 mg/dL — ABNORMAL HIGH (ref 0–99)
TRIGLYCERIDES: 73 mg/dL (ref <150)
Total CHOL/HDL Ratio: 4.4 Ratio
VLDL: 15 mg/dL (ref 0–40)

## 2013-09-26 NOTE — Patient Instructions (Addendum)
F/u in 3 month, call if you need me before  CBC, lipid, cmp , TSH, HBA1C, vit D today  You are referred for sleep disorder  Weight loss goal of 1 pound per week  You will  receive 1200 and 1500 diet sheets

## 2013-09-27 ENCOUNTER — Encounter: Payer: Self-pay | Admitting: Family Medicine

## 2013-09-27 LAB — FERRITIN: FERRITIN: 7 ng/mL — AB (ref 10–291)

## 2013-09-27 LAB — T4, FREE: FREE T4: 0.23 ng/dL — AB (ref 0.80–1.80)

## 2013-09-27 LAB — IRON: IRON: 60 ug/dL (ref 42–145)

## 2013-09-27 LAB — T3, FREE: T3, Free: 0.5 pg/mL — ABNORMAL LOW (ref 2.3–4.2)

## 2013-09-27 LAB — VITAMIN D 25 HYDROXY (VIT D DEFICIENCY, FRACTURES): Vit D, 25-Hydroxy: 37 ng/mL (ref 30–89)

## 2013-09-27 LAB — TSH: TSH: 68.039 u[IU]/mL — ABNORMAL HIGH (ref 0.350–4.500)

## 2013-09-29 DIAGNOSIS — E8881 Metabolic syndrome: Secondary | ICD-10-CM | POA: Insufficient documentation

## 2013-09-29 NOTE — Assessment & Plan Note (Signed)
deterioated , likely as a result of severe hypothyroidism as well as diet  Hyperlipidemia:Low fat diet discussed and encouraged.  lipitor recommended also

## 2013-09-29 NOTE — Progress Notes (Signed)
   Subjective:    Patient ID: George Ina, female    DOB: 1975-08-09, 38 y.o.   MRN: [id number]  HPI Pt in with main concern of weight gain despite best efforts to lose weight with diet and exercise. She had RAI in 2014 for Graves, was lost to f/u with endo as she had no insurance, and now has returned. Heaviest she has been, requesting appetitie suppressant and labs are past due   Review of Systems See HPI Denies recent fever or chills. Denies sinus pressure, nasal congestion, ear pain or sore throat. Denies chest congestion, productive cough or wheezing. Denies chest pains, palpitations and leg swelling Denies abdominal pain, nausea, vomiting,diarrhea or constipation.   Denies dysuria, frequency, hesitancy or incontinence. C/o increased low back pain which she attributes in part to her weight Denies headaches, seizures, numbness, or tingling. Denies depression, uncontrolled  anxiety or insomnia. Denies skin break down or rash.        Objective:   Physical Exam BP 120/80  Pulse 108  Resp 16  Wt 265 lb (120.203 kg)  SpO2 99% Patient alert and oriented and in no cardiopulmonary distress.  HEENT: No facial asymmetry, EOMI, no sinus tenderness,  oropharynx pink and moist.  Neck thick  supple no adenopathy.  Chest: Clear to auscultation bilaterally.  CVS: S1, S2 no murmurs, no S3.  ABD: Soft non tender. Bowel sounds normal.  Ext: No edema  MS: Adequate ROM spine, shoulders, hips and knees.  Skin: Intact, no ulcerations or rash noted.  Psych: Good eye contact, normal affect. Memory intact not anxious or depressed appearing.  CNS: CN 2-12 intact, power, tone and sensation normal throughout.        Assessment & Plan:  Hypothyroidism, postradioiodine therapy Under corrected , needs increased dose of replacement therapy with 6 week f/u  IGT (impaired glucose tolerance) Deteriorated. Metformin recommended Patient educated about the importance of limiting   Carbohydrate intake , the need to commit to daily physical activity for a minimum of 30 minutes , and to commit weight loss. The fact that changes in all these areas will reduce or eliminate all together the development of diabetes is stressed.     OBESITY Deteriorated. Patient re-educated about  the importance of commitment to a  minimum of 150 minutes of exercise per week. The importance of healthy food choices with portion control discussed. Encouraged to start a food diary, count calories and to consider  joining a support group. Sample diet sheets offered. Goals set by the patient for the next several months.     DYSLIPIDEMIA deterioated , likely as a result of severe hypothyroidism as well as diet  Hyperlipidemia:Low fat diet discussed and encouraged.  lipitor recommended also  Sleep disorder Pt reports falling asleep easily and awakens herself  to excessive snoring Has thick neck , and  centraal obesity , needs sleep study  Metabolic syndrome X The increased risk of cardiovascular disease associated with this diagnosis, and the need to consistently work on lifestyle to change this is discussed. Following  a  heart healthy diet ,commitment to 30 minutes of exercise at least 5 days per week, as well as control of blood sugar and cholesterol , and achieving a healthy weight are all the areas to be addressed .

## 2013-09-29 NOTE — Assessment & Plan Note (Signed)
Under corrected , needs increased dose of replacement therapy with 6 week f/u

## 2013-09-29 NOTE — Assessment & Plan Note (Signed)
The increased risk of cardiovascular disease associated with this diagnosis, and the need to consistently work on lifestyle to change this is discussed. Following  a  heart healthy diet ,commitment to 30 minutes of exercise at least 5 days per week, as well as control of blood sugar and cholesterol , and achieving a healthy weight are all the areas to be addressed .  

## 2013-09-29 NOTE — Assessment & Plan Note (Signed)
Deteriorated. Patient re-educated about  the importance of commitment to a  minimum of 150 minutes of exercise per week. The importance of healthy food choices with portion control discussed. Encouraged to start a food diary, count calories and to consider  joining a support group. Sample diet sheets offered. Goals set by the patient for the next several months.    

## 2013-09-29 NOTE — Assessment & Plan Note (Signed)
Pt reports falling asleep easily and awakens herself  to excessive snoring Has thick neck , and  centraal obesity , needs sleep study

## 2013-09-29 NOTE — Assessment & Plan Note (Signed)
Deteriorated. Metformin recommended Patient educated about the importance of limiting  Carbohydrate intake , the need to commit to daily physical activity for a minimum of 30 minutes , and to commit weight loss. The fact that changes in all these areas will reduce or eliminate all together the development of diabetes is stressed.

## 2013-10-02 ENCOUNTER — Other Ambulatory Visit: Payer: Self-pay

## 2013-10-02 ENCOUNTER — Telehealth: Payer: Self-pay | Admitting: Family Medicine

## 2013-10-02 DIAGNOSIS — E785 Hyperlipidemia, unspecified: Secondary | ICD-10-CM

## 2013-10-02 DIAGNOSIS — R7302 Impaired glucose tolerance (oral): Secondary | ICD-10-CM

## 2013-10-02 DIAGNOSIS — E0590 Thyrotoxicosis, unspecified without thyrotoxic crisis or storm: Secondary | ICD-10-CM

## 2013-10-02 MED ORDER — METFORMIN HCL 500 MG PO TABS: 500.0000 mg | ORAL_TABLET | Freq: Two times a day (BID) | ORAL | Status: DC

## 2013-10-02 MED ORDER — ATORVASTATIN CALCIUM 20 MG PO TABS: 20.0000 mg | ORAL_TABLET | Freq: Every day | ORAL | Status: DC

## 2013-10-02 MED ORDER — LEVOTHYROXINE SODIUM 150 MCG PO TABS: 150.0000 ug | ORAL_TABLET | Freq: Every day | ORAL | Status: DC

## 2013-10-02 NOTE — Telephone Encounter (Signed)
Courtney spoke with patient

## 2013-10-02 NOTE — Addendum Note (Signed)
Addended by: Denman George B on: 10/02/2013 02:51 PM   Modules accepted: Orders

## 2013-11-08 ENCOUNTER — Emergency Department (HOSPITAL_COMMUNITY): Payer: 59

## 2013-11-08 ENCOUNTER — Emergency Department (HOSPITAL_COMMUNITY)
Admission: EM | Admit: 2013-11-08 | Discharge: 2013-11-08 | Disposition: A | Discharge status: Home / Self Care | Payer: 59 | Attending: Emergency Medicine | Admitting: Emergency Medicine

## 2013-11-08 ENCOUNTER — Encounter (HOSPITAL_COMMUNITY): Payer: Self-pay | Admitting: Radiology

## 2013-11-08 DIAGNOSIS — IMO0002 Reserved for concepts with insufficient information to code with codable children: Secondary | ICD-10-CM | POA: Insufficient documentation

## 2013-11-08 DIAGNOSIS — Z3202 Encounter for pregnancy test, result negative: Secondary | ICD-10-CM | POA: Insufficient documentation

## 2013-11-08 DIAGNOSIS — Z862 Personal history of diseases of the blood and blood-forming organs and certain disorders involving the immune mechanism: Secondary | ICD-10-CM | POA: Insufficient documentation

## 2013-11-08 DIAGNOSIS — E669 Obesity, unspecified: Secondary | ICD-10-CM | POA: Insufficient documentation

## 2013-11-08 DIAGNOSIS — S8990XA Unspecified injury of unspecified lower leg, initial encounter: Secondary | ICD-10-CM | POA: Insufficient documentation

## 2013-11-08 DIAGNOSIS — Z8742 Personal history of other diseases of the female genital tract: Secondary | ICD-10-CM | POA: Insufficient documentation

## 2013-11-08 DIAGNOSIS — S52609A Unspecified fracture of lower end of unspecified ulna, initial encounter for closed fracture: Principal | ICD-10-CM

## 2013-11-08 DIAGNOSIS — S52509A Unspecified fracture of the lower end of unspecified radius, initial encounter for closed fracture: Secondary | ICD-10-CM | POA: Insufficient documentation

## 2013-11-08 DIAGNOSIS — T148XXA Other injury of unspecified body region, initial encounter: Secondary | ICD-10-CM

## 2013-11-08 DIAGNOSIS — Z79899 Other long term (current) drug therapy: Secondary | ICD-10-CM | POA: Insufficient documentation

## 2013-11-08 DIAGNOSIS — Y9389 Activity, other specified: Secondary | ICD-10-CM | POA: Insufficient documentation

## 2013-11-08 DIAGNOSIS — S62102A Fracture of unspecified carpal bone, left wrist, initial encounter for closed fracture: Secondary | ICD-10-CM

## 2013-11-08 DIAGNOSIS — S3981XA Other specified injuries of abdomen, initial encounter: Secondary | ICD-10-CM | POA: Insufficient documentation

## 2013-11-08 DIAGNOSIS — S1093XA Contusion of unspecified part of neck, initial encounter: Secondary | ICD-10-CM

## 2013-11-08 DIAGNOSIS — Y92410 Unspecified street and highway as the place of occurrence of the external cause: Secondary | ICD-10-CM | POA: Insufficient documentation

## 2013-11-08 DIAGNOSIS — S0003XA Contusion of scalp, initial encounter: Secondary | ICD-10-CM | POA: Insufficient documentation

## 2013-11-08 DIAGNOSIS — Z8709 Personal history of other diseases of the respiratory system: Secondary | ICD-10-CM | POA: Insufficient documentation

## 2013-11-08 DIAGNOSIS — S99919A Unspecified injury of unspecified ankle, initial encounter: Secondary | ICD-10-CM

## 2013-11-08 DIAGNOSIS — S0083XA Contusion of other part of head, initial encounter: Secondary | ICD-10-CM | POA: Insufficient documentation

## 2013-11-08 DIAGNOSIS — E785 Hyperlipidemia, unspecified: Secondary | ICD-10-CM | POA: Insufficient documentation

## 2013-11-08 DIAGNOSIS — E0590 Thyrotoxicosis, unspecified without thyrotoxic crisis or storm: Secondary | ICD-10-CM | POA: Insufficient documentation

## 2013-11-08 DIAGNOSIS — S99929A Unspecified injury of unspecified foot, initial encounter: Secondary | ICD-10-CM

## 2013-11-08 LAB — CBC
HEMATOCRIT: 29.6 % — AB (ref 36.0–46.0)
Hemoglobin: 9.3 g/dL — ABNORMAL LOW (ref 12.0–15.0)
MCH: 25 pg — AB (ref 26.0–34.0)
MCHC: 31.4 g/dL (ref 30.0–36.0)
MCV: 79.6 fL (ref 78.0–100.0)
PLATELETS: 238 10*3/uL (ref 150–400)
RBC: 3.72 MIL/uL — AB (ref 3.87–5.11)
RDW: 16.5 % — ABNORMAL HIGH (ref 11.5–15.5)
WBC: 6.3 10*3/uL (ref 4.0–10.5)

## 2013-11-08 LAB — ETHANOL

## 2013-11-08 LAB — COMPREHENSIVE METABOLIC PANEL
ALT: 9 U/L (ref 0–35)
AST: 21 U/L (ref 0–37)
Albumin: 3.8 g/dL (ref 3.5–5.2)
Alkaline Phosphatase: 48 U/L (ref 39–117)
BUN: 18 mg/dL (ref 6–23)
CO2: 22 meq/L (ref 19–32)
CREATININE: 0.73 mg/dL (ref 0.50–1.10)
Calcium: 8.8 mg/dL (ref 8.4–10.5)
Chloride: 102 mEq/L (ref 96–112)
Glucose, Bld: 119 mg/dL — ABNORMAL HIGH (ref 70–99)
Potassium: 4 mEq/L (ref 3.7–5.3)
Sodium: 136 mEq/L — ABNORMAL LOW (ref 137–147)
Total Protein: 7.7 g/dL (ref 6.0–8.3)

## 2013-11-08 MED ORDER — HYDROMORPHONE HCL PF 1 MG/ML IJ SOLN
1.0000 mg | Freq: Once | INTRAMUSCULAR | Status: AC
  Administered 2013-11-08: 1 mg via INTRAVENOUS
  Filled 2013-11-08: qty 1

## 2013-11-08 MED ORDER — HYDROMORPHONE HCL PF 1 MG/ML IJ SOLN
1.0000 mg | INTRAMUSCULAR | Status: DC | PRN
  Administered 2013-11-08 (×2): 1 mg via INTRAVENOUS
  Filled 2013-11-08 (×2): qty 1

## 2013-11-08 MED ORDER — OXYCODONE-ACETAMINOPHEN 5-325 MG PO TABS: 1.0000 | ORAL_TABLET | Freq: Four times a day (QID) | ORAL | Status: DC | PRN

## 2013-11-08 MED ORDER — HYDROMORPHONE HCL PF 1 MG/ML IJ SOLN
INTRAMUSCULAR | Status: AC
  Filled 2013-11-08: qty 1

## 2013-11-08 MED ORDER — HYDROCODONE-ACETAMINOPHEN 5-325 MG PO TABS: 1.0000 | ORAL_TABLET | Freq: Four times a day (QID) | ORAL | Status: DC | PRN

## 2013-11-08 MED ORDER — SODIUM CHLORIDE 0.9 % IV BOLUS (SEPSIS)
1000.0000 mL | INTRAVENOUS | Status: AC
  Administered 2013-11-08: 1000 mL via INTRAVENOUS

## 2013-11-08 MED ORDER — LIDOCAINE HCL (PF) 1 % IJ SOLN
INTRAMUSCULAR | Status: AC
  Filled 2013-11-08: qty 5

## 2013-11-08 MED ORDER — IOHEXOL 300 MG/ML  SOLN: 100.0000 mL | Freq: Once | INTRAMUSCULAR | Status: DC | PRN

## 2013-11-08 NOTE — ED Notes (Signed)
Pt reported R hip pain and left wrist pain.

## 2013-11-08 NOTE — Discharge Instructions (Signed)
Cast or Splint Care °Casts and splints support injured limbs and keep bones from moving while they heal.  °HOME CARE °· Keep the cast or splint uncovered during the drying period. °· A plaster cast can take 24 to 48 hours to dry. °· A fiberglass cast will dry in less than 1 hour. °· Do not rest the cast on anything harder than a pillow for 24 hours. °· Do not put weight on your injured limb. Do not put pressure on the cast. Wait for your doctor's approval. °· Keep the cast or splint dry. °· Cover the cast or splint with a plastic bag during baths or wet weather. °· If you have a cast over your chest and belly (trunk), take sponge baths until the cast is taken off. °· If your cast gets wet, dry it with a towel or blow dryer. Use the cool setting on the blow dryer. °· Keep your cast or splint clean. Wash a dirty cast with a damp cloth. °· Do not put any objects under your cast or splint. °· Do not scratch the skin under the cast with an object. If itching is a problem, use a blow dryer on a cool setting over the itchy area. °· Do not trim or cut your cast. °· Do not take out the padding from inside your cast. °· Exercise your joints near the cast as told by your doctor. °· Raise (elevate) your injured limb on 1 or 2 pillows for the first 1 to 3 days. °GET HELP IF: °· Your cast or splint cracks. °· Your cast or splint is too tight or too loose. °· You itch badly under the cast. °· Your cast gets wet or has a soft spot. °· You have a bad smell coming from the cast. °· You get an object stuck under the cast. °· Your skin around the cast becomes red or sore. °· You have new or more pain after the cast is put on. °GET HELP RIGHT AWAY IF: °· You have fluid leaking through the cast. °· You cannot move your fingers or toes. °· Your fingers or toes turn blue or white or are cool, painful, or puffy (swollen). °· You have tingling or lose feeling (numbness) around the injured area. °· You have bad pain or pressure under the  cast. °· You have trouble breathing or have shortness of breath. °· You have chest pain. °Document Released: 10/14/2010 Document Revised: 02/14/2013 Document Reviewed: 12/21/2012 °[employer]® Patient Information ©2014 [employer]. ° °

## 2013-11-08 NOTE — ED Notes (Signed)
Pt returned from CT; has call bell. No needs.

## 2013-11-08 NOTE — ED Notes (Signed)
Xray called; pt back to room; CT not ready.

## 2013-11-08 NOTE — ED Notes (Signed)
Family reports pt is much more comfortable.

## 2013-11-08 NOTE — ED Notes (Signed)
Hand surgeon at bedside. 
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
## 2013-11-08 NOTE — ED Notes (Signed)
Family at bedside. 

## 2013-11-08 NOTE — ED Notes (Addendum)
dilaudid pulled up and hooked to IV hub; pulled back and diluted with NS from EMS bag infusing into IV. Syringe turned pink. Not pushed; another RN in room to witness. Dilaudid wasted with RN and fresh IV saline and dilaudid pushed. Pharmacy informed and instructed to "waste it"
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
## 2013-11-08 NOTE — ED Notes (Signed)
Per EDP; c collar removed.
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
## 2013-11-08 NOTE — Progress Notes (Signed)
Orthopedic Tech Progress Note Patient Details:  Doris Romero 1976-01-09 [id number] Reduction performed by Dr. Fredna Dow followed by application of sugartong splint applied to LUE. Application tolerated well. Arm sling provided. Ortho Devices Type of Ortho Device: Sugartong splint;Arm sling Ortho Device/Splint Location: LUE Ortho Device/Splint Interventions: Application   Asia R Thompson 11/08/2013, 3:28 PM

## 2013-11-08 NOTE — ED Notes (Signed)
Ortho tech paged and informed of the requests of the hand surgeon who is at the bedside. She reports she will be down soon.
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
## 2013-11-08 NOTE — ED Provider Notes (Signed)
CT d/w Pt; Patient informed of clinical course, understand medical decision-making process, and agree with plan.  Babette Relic, MD 11/09/13 2141

## 2013-11-08 NOTE — ED Notes (Signed)
Patient transported to X-ray 

## 2013-11-08 NOTE — ED Notes (Signed)
Hand surgeon at beside to block wrist.

## 2013-11-14 ENCOUNTER — Encounter (HOSPITAL_BASED_OUTPATIENT_CLINIC_OR_DEPARTMENT_OTHER): Payer: Self-pay | Admitting: *Deleted

## 2013-11-14 ENCOUNTER — Other Ambulatory Visit: Payer: Self-pay | Admitting: Orthopedic Surgery

## 2013-11-15 ENCOUNTER — Emergency Department (HOSPITAL_COMMUNITY)
Admission: EM | Admit: 2013-11-15 | Discharge: 2013-11-16 | Disposition: A | Discharge status: Home / Self Care | Payer: 59 | Attending: Emergency Medicine | Admitting: Emergency Medicine

## 2013-11-15 ENCOUNTER — Encounter (HOSPITAL_BASED_OUTPATIENT_CLINIC_OR_DEPARTMENT_OTHER)
Admission: RE | Disposition: A | Discharge status: Home / Self Care | Payer: Self-pay | Source: Ambulatory Visit | Attending: Orthopedic Surgery

## 2013-11-15 ENCOUNTER — Emergency Department (HOSPITAL_COMMUNITY)
Admission: EM | Admit: 2013-11-15 | Discharge: 2013-11-15 | Disposition: A | Discharge status: Home / Self Care | Payer: 59

## 2013-11-15 ENCOUNTER — Encounter (HOSPITAL_BASED_OUTPATIENT_CLINIC_OR_DEPARTMENT_OTHER): Payer: Self-pay | Admitting: *Deleted

## 2013-11-15 ENCOUNTER — Ambulatory Visit (HOSPITAL_BASED_OUTPATIENT_CLINIC_OR_DEPARTMENT_OTHER): Payer: 59 | Admitting: Anesthesiology

## 2013-11-15 ENCOUNTER — Encounter (HOSPITAL_BASED_OUTPATIENT_CLINIC_OR_DEPARTMENT_OTHER): Payer: 59 | Admitting: Anesthesiology

## 2013-11-15 ENCOUNTER — Ambulatory Visit (HOSPITAL_BASED_OUTPATIENT_CLINIC_OR_DEPARTMENT_OTHER)
Admission: RE | Admit: 2013-11-15 | Discharge: 2013-11-15 | Disposition: A | Discharge status: Home / Self Care | Payer: 59 | Source: Ambulatory Visit | Attending: Orthopedic Surgery | Admitting: Orthopedic Surgery

## 2013-11-15 DIAGNOSIS — Z862 Personal history of diseases of the blood and blood-forming organs and certain disorders involving the immune mechanism: Secondary | ICD-10-CM | POA: Insufficient documentation

## 2013-11-15 DIAGNOSIS — E785 Hyperlipidemia, unspecified: Secondary | ICD-10-CM | POA: Insufficient documentation

## 2013-11-15 DIAGNOSIS — M25539 Pain in unspecified wrist: Secondary | ICD-10-CM | POA: Insufficient documentation

## 2013-11-15 DIAGNOSIS — Z887 Allergy status to serum and vaccine status: Secondary | ICD-10-CM | POA: Insufficient documentation

## 2013-11-15 DIAGNOSIS — M7989 Other specified soft tissue disorders: Secondary | ICD-10-CM | POA: Insufficient documentation

## 2013-11-15 DIAGNOSIS — R209 Unspecified disturbances of skin sensation: Secondary | ICD-10-CM | POA: Insufficient documentation

## 2013-11-15 DIAGNOSIS — E0590 Thyrotoxicosis, unspecified without thyrotoxic crisis or storm: Secondary | ICD-10-CM | POA: Insufficient documentation

## 2013-11-15 DIAGNOSIS — Z8659 Personal history of other mental and behavioral disorders: Secondary | ICD-10-CM | POA: Insufficient documentation

## 2013-11-15 DIAGNOSIS — Z8742 Personal history of other diseases of the female genital tract: Secondary | ICD-10-CM | POA: Insufficient documentation

## 2013-11-15 DIAGNOSIS — Z8709 Personal history of other diseases of the respiratory system: Secondary | ICD-10-CM | POA: Insufficient documentation

## 2013-11-15 DIAGNOSIS — F411 Generalized anxiety disorder: Secondary | ICD-10-CM | POA: Insufficient documentation

## 2013-11-15 DIAGNOSIS — E669 Obesity, unspecified: Secondary | ICD-10-CM | POA: Insufficient documentation

## 2013-11-15 DIAGNOSIS — G8918 Other acute postprocedural pain: Secondary | ICD-10-CM | POA: Insufficient documentation

## 2013-11-15 DIAGNOSIS — D649 Anemia, unspecified: Secondary | ICD-10-CM | POA: Insufficient documentation

## 2013-11-15 DIAGNOSIS — S52509A Unspecified fracture of the lower end of unspecified radius, initial encounter for closed fracture: Secondary | ICD-10-CM | POA: Insufficient documentation

## 2013-11-15 DIAGNOSIS — S52609A Unspecified fracture of lower end of unspecified ulna, initial encounter for closed fracture: Principal | ICD-10-CM

## 2013-11-15 DIAGNOSIS — Z792 Long term (current) use of antibiotics: Secondary | ICD-10-CM | POA: Insufficient documentation

## 2013-11-15 DIAGNOSIS — Z6841 Body Mass Index (BMI) 40.0 and over, adult: Secondary | ICD-10-CM | POA: Insufficient documentation

## 2013-11-15 DIAGNOSIS — Z79899 Other long term (current) drug therapy: Secondary | ICD-10-CM | POA: Insufficient documentation

## 2013-11-15 DIAGNOSIS — Z8781 Personal history of (healed) traumatic fracture: Secondary | ICD-10-CM | POA: Insufficient documentation

## 2013-11-15 DIAGNOSIS — Y92410 Unspecified street and highway as the place of occurrence of the external cause: Secondary | ICD-10-CM | POA: Insufficient documentation

## 2013-11-15 DIAGNOSIS — M25532 Pain in left wrist: Secondary | ICD-10-CM

## 2013-11-15 DIAGNOSIS — Z882 Allergy status to sulfonamides status: Secondary | ICD-10-CM | POA: Insufficient documentation

## 2013-11-15 HISTORY — PX: OPEN REDUCTION INTERNAL FIXATION (ORIF) DISTAL RADIAL FRACTURE: SHX5989

## 2013-11-15 HISTORY — DX: Anxiety disorder, unspecified: F41.9

## 2013-11-15 HISTORY — DX: Shortness of breath: R06.02

## 2013-11-15 HISTORY — DX: Unspecified fracture of the lower end of left radius, initial encounter for closed fracture: S52.502A

## 2013-11-15 HISTORY — DX: Sleep apnea, unspecified: G47.30

## 2013-11-15 SURGERY — OPEN REDUCTION INTERNAL FIXATION (ORIF) DISTAL RADIUS FRACTURE
Anesthesia: Monitor Anesthesia Care | Site: Wrist | Laterality: Left

## 2013-11-15 MED ORDER — MIDAZOLAM HCL 2 MG/2ML IJ SOLN
1.0000 mg | INTRAMUSCULAR | Status: DC | PRN
  Administered 2013-11-15: 2 mg via INTRAVENOUS

## 2013-11-15 MED ORDER — CEFAZOLIN SODIUM-DEXTROSE 2-3 GM-% IV SOLR
2.0000 g | INTRAVENOUS | Status: AC
Start: 2013-11-15 — End: 2013-11-15
  Administered 2013-11-15: 2 g via INTRAVENOUS

## 2013-11-15 MED ORDER — LACTATED RINGERS IV SOLN
INTRAVENOUS | Status: DC
  Administered 2013-11-15 (×2): via INTRAVENOUS

## 2013-11-15 MED ORDER — FENTANYL CITRATE 0.05 MG/ML IJ SOLN
INTRAMUSCULAR | Status: DC | PRN
  Administered 2013-11-15 (×6): 25 ug via INTRAVENOUS

## 2013-11-15 MED ORDER — PROPOFOL INFUSION 10 MG/ML OPTIME
INTRAVENOUS | Status: DC | PRN
  Administered 2013-11-15: 75 ug/kg/min via INTRAVENOUS

## 2013-11-15 MED ORDER — OXYCODONE-ACETAMINOPHEN 5-325 MG PO TABS: ORAL_TABLET | ORAL | Status: DC

## 2013-11-15 MED ORDER — BUPIVACAINE-EPINEPHRINE (PF) 0.5% -1:200000 IJ SOLN
INTRAMUSCULAR | Status: DC | PRN
  Administered 2013-11-15: 30 mL

## 2013-11-15 MED ORDER — ONDANSETRON HCL 4 MG/2ML IJ SOLN
INTRAMUSCULAR | Status: DC | PRN
  Administered 2013-11-15: 4 mg via INTRAVENOUS

## 2013-11-15 MED ORDER — FENTANYL CITRATE 0.05 MG/ML IJ SOLN
INTRAMUSCULAR | Status: AC
  Filled 2013-11-15: qty 4

## 2013-11-15 MED ORDER — DOXYCYCLINE HYCLATE 50 MG PO CAPS: 100.0000 mg | ORAL_CAPSULE | Freq: Two times a day (BID) | ORAL | Status: DC

## 2013-11-15 MED ORDER — PROPOFOL 10 MG/ML IV BOLUS
INTRAVENOUS | Status: AC
  Filled 2013-11-15: qty 20

## 2013-11-15 MED ORDER — MIDAZOLAM HCL 2 MG/2ML IJ SOLN
INTRAMUSCULAR | Status: AC
  Filled 2013-11-15: qty 2

## 2013-11-15 MED ORDER — BUPIVACAINE HCL (PF) 0.5 % IJ SOLN
INTRAMUSCULAR | Status: DC | PRN
  Administered 2013-11-15: 10 mL

## 2013-11-15 MED ORDER — LIDOCAINE HCL (CARDIAC) 20 MG/ML IV SOLN
INTRAVENOUS | Status: DC | PRN
  Administered 2013-11-15: 50 mg via INTRAVENOUS

## 2013-11-15 MED ORDER — HYDROMORPHONE HCL PF 1 MG/ML IJ SOLN: 0.2500 mg | INTRAMUSCULAR | Status: DC | PRN

## 2013-11-15 MED ORDER — CHLORHEXIDINE GLUCONATE 4 % EX LIQD
60.0000 mL | Freq: Once | CUTANEOUS | Status: DC
Start: 2013-11-15 — End: 2013-11-15

## 2013-11-15 MED ORDER — OXYCODONE HCL 5 MG/5ML PO SOLN: 5.0000 mg | Freq: Once | ORAL | Status: DC | PRN

## 2013-11-15 MED ORDER — OXYCODONE HCL 5 MG PO TABS: 5.0000 mg | ORAL_TABLET | Freq: Once | ORAL | Status: DC | PRN

## 2013-11-15 MED ORDER — FENTANYL CITRATE 0.05 MG/ML IJ SOLN
50.0000 ug | INTRAMUSCULAR | Status: DC | PRN
  Administered 2013-11-15: 100 ug via INTRAVENOUS

## 2013-11-15 MED ORDER — FENTANYL CITRATE 0.05 MG/ML IJ SOLN
INTRAMUSCULAR | Status: AC
  Filled 2013-11-15: qty 2

## 2013-11-15 SURGICAL SUPPLY — 71 items
BANDAGE ELASTIC 3 VELCRO ST LF (GAUZE/BANDAGES/DRESSINGS) ×3 IMPLANT
BANDAGE ELASTIC 4 VELCRO ST LF (GAUZE/BANDAGES/DRESSINGS) ×2 IMPLANT
BIT DRILL 2.0 LNG QUCK RELEASE (BIT) IMPLANT
BIT DRILL 2.8X5 QR DISP (BIT) ×2 IMPLANT
BLADE MINI RND TIP GREEN BEAV (BLADE) IMPLANT
BLADE SURG 15 STRL LF DISP TIS (BLADE) ×2 IMPLANT
BLADE SURG 15 STRL SS (BLADE) ×6
BNDG CMPR 9X4 STRL LF SNTH (GAUZE/BANDAGES/DRESSINGS) ×1
BNDG ESMARK 4X9 LF (GAUZE/BANDAGES/DRESSINGS) ×3 IMPLANT
BNDG GAUZE ELAST 4 BULKY (GAUZE/BANDAGES/DRESSINGS) ×3 IMPLANT
BNDG PLASTER X FAST 3X3 WHT LF (CAST SUPPLIES) ×8 IMPLANT
BNDG PLSTR 9X3 FST ST WHT (CAST SUPPLIES) ×4
CHLORAPREP W/TINT 26ML (MISCELLANEOUS) ×3 IMPLANT
CORDS BIPOLAR (ELECTRODE) ×3 IMPLANT
COVER MAYO STAND STRL (DRAPES) ×3 IMPLANT
COVER TABLE BACK 60X90 (DRAPES) ×3 IMPLANT
DRAPE EXTREMITY T 121X128X90 (DRAPE) ×3 IMPLANT
DRAPE OEC MINIVIEW 54X84 (DRAPES) ×5 IMPLANT
DRAPE SURG 17X23 STRL (DRAPES) ×3 IMPLANT
DRILL 2.0 LNG QUICK RELEASE (BIT) ×3
GAUZE SPONGE 4X4 12PLY STRL (GAUZE/BANDAGES/DRESSINGS) ×3 IMPLANT
GAUZE XEROFORM 1X8 LF (GAUZE/BANDAGES/DRESSINGS) ×3 IMPLANT
GLOVE BIO SURGEON STRL SZ7.5 (GLOVE) ×3 IMPLANT
GLOVE BIOGEL PI IND STRL 8 (GLOVE) ×1 IMPLANT
GLOVE BIOGEL PI IND STRL 8.5 (GLOVE) IMPLANT
GLOVE BIOGEL PI INDICATOR 8 (GLOVE) ×2
GLOVE BIOGEL PI INDICATOR 8.5 (GLOVE)
GLOVE SURG ORTHO 8.0 STRL STRW (GLOVE) IMPLANT
GOWN STRL REUS W/ TWL LRG LVL3 (GOWN DISPOSABLE) ×1 IMPLANT
GOWN STRL REUS W/TWL LRG LVL3 (GOWN DISPOSABLE) ×3
GOWN STRL REUS W/TWL XL LVL3 (GOWN DISPOSABLE) ×3 IMPLANT
GUIDEWIRE ORTHO 0.054X6 (WIRE) ×6 IMPLANT
NDL HYPO 25X1 1.5 SAFETY (NEEDLE) IMPLANT
NEEDLE HYPO 25X1 1.5 SAFETY (NEEDLE) IMPLANT
NS IRRIG 1000ML POUR BTL (IV SOLUTION) ×3 IMPLANT
PACK BASIN DAY SURGERY FS (CUSTOM PROCEDURE TRAY) ×3 IMPLANT
PAD CAST 3X4 CTTN HI CHSV (CAST SUPPLIES) ×1 IMPLANT
PADDING CAST ABS 4INX4YD NS (CAST SUPPLIES)
PADDING CAST ABS COTTON 4X4 ST (CAST SUPPLIES) ×1 IMPLANT
PADDING CAST COTTON 3X4 STRL (CAST SUPPLIES) ×3
PLATE LEFT DIST RADIUS NARROW (Plate) ×2 IMPLANT
SCREW CORT FT 18X2.3XLCK HEX (Screw) IMPLANT
SCREW CORT FT 20X2.3XLCK HEX (Screw) IMPLANT
SCREW CORT FX14X2.3XLCK NS (Screw) IMPLANT
SCREW CORTICAL LOCKING 2.3X14M (Screw) ×3 IMPLANT
SCREW CORTICAL LOCKING 2.3X16M (Screw) ×3 IMPLANT
SCREW CORTICAL LOCKING 2.3X18M (Screw) ×3 IMPLANT
SCREW CORTICAL LOCKING 2.3X20M (Screw) ×12 IMPLANT
SCREW FX16X2.3XLCK SMTH NS CRT (Screw) IMPLANT
SCREW FX20X2.3XSMTH LCK NS CRT (Screw) IMPLANT
SCREW NLCKG 13 3.5X13 HEXA (Screw) IMPLANT
SCREW NON TOGG 2.3X18MM (Screw) ×2 IMPLANT
SCREW NON-LOCK 3.5X13 (Screw) ×3 IMPLANT
SCREW NONLOCK HEX 3.5X12 (Screw) ×4 IMPLANT
SLEEVE SCD COMPRESS KNEE MED (MISCELLANEOUS) ×2 IMPLANT
SLING ARM LRG ADULT FOAM STRAP (SOFTGOODS) ×2 IMPLANT
SPLINT PLASTER CAST XFAST 4X15 (CAST SUPPLIES) IMPLANT
SPLINT PLASTER XTRA FAST SET 4 (CAST SUPPLIES)
STOCKINETTE 4X48 STRL (DRAPES) ×3 IMPLANT
SUCTION FRAZIER TIP 10 FR DISP (SUCTIONS) IMPLANT
SUT ETHILON 3 0 PS 1 (SUTURE) IMPLANT
SUT ETHILON 4 0 PS 2 18 (SUTURE) ×5 IMPLANT
SUT VIC AB 3-0 PS1 18 (SUTURE)
SUT VIC AB 3-0 PS1 18XBRD (SUTURE) IMPLANT
SUT VICRYL 4-0 PS2 18IN ABS (SUTURE) ×3 IMPLANT
SYR BULB 3OZ (MISCELLANEOUS) ×3 IMPLANT
SYR CONTROL 10ML LL (SYRINGE) IMPLANT
TOWEL OR 17X24 6PK STRL BLUE (TOWEL DISPOSABLE) ×6 IMPLANT
TUBE CONNECTING 20'X1/4 (TUBING)
TUBE CONNECTING 20X1/4 (TUBING) IMPLANT
UNDERPAD 30X30 INCONTINENT (UNDERPADS AND DIAPERS) ×3 IMPLANT

## 2013-11-15 NOTE — Transfer of Care (Signed)
Immediate Anesthesia Transfer of Care Note  Patient: Doris Romero  Procedure(s) Performed: Procedure(s): OPEN REDUCTION INTERNAL FIXATION (ORIF) LEFT  DISTAL RADIUS  AND POSSIBLE PINNING VS ORIF ULNA    (Left)  Patient Location: PACU  Anesthesia Type:MAC combined with regional for post-op pain  Level of Consciousness: awake, alert  and oriented  Airway & Oxygen Therapy: Patient Spontanous Breathing and Patient connected to face mask oxygen  Post-op Assessment: Report given to PACU RN and Post -op Vital signs reviewed and stable  Post vital signs: Reviewed and stable  Complications: No apparent anesthesia complications

## 2013-11-15 NOTE — Op Note (Signed)
[id number] 

## 2013-11-15 NOTE — Progress Notes (Signed)
Assisted Dr. Fitzgerald with left, ultrasound guided, supraclavicular block. Side rails up, monitors on throughout procedure. See vital signs in flow sheet. Tolerated Procedure well. 

## 2013-11-15 NOTE — Anesthesia Postprocedure Evaluation (Signed)
Anesthesia Post Note  Patient: Doris Romero  Procedure(s) Performed: Procedure(s) (LRB): OPEN REDUCTION INTERNAL FIXATION (ORIF) LEFT  DISTAL RADIUS  AND POSSIBLE PINNING VS ORIF ULNA    (Left)  Anesthesia type: general  Patient location: PACU  Post pain: Pain level controlled  Post assessment: Patient's Cardiovascular Status Stable  Last Vitals:  Filed Vitals:   11/15/13 1215  BP: 110/73  Pulse: 81  Temp:   Resp: 19    Post vital signs: Reviewed and stable  Level of consciousness: sedated  Complications: No apparent anesthesia complications
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
## 2013-11-15 NOTE — Discharge Instructions (Addendum)
[facility] Instructions °Hand Surgery ° °Wound Care: °Keep your hand elevated above the level of your heart.  Do not allow it to dangle by your side.  Keep the dressing dry and do not remove it unless your doctor advises you to do so.  He will usually change it at the time of your post-op visit.  Moving your fingers is advised to stimulate circulation but will depend on the site of your surgery.  If you have a splint applied, your doctor will advise you regarding movement. ° °Activity: °Do not drive or operate machinery today.  Rest today and then you may return to your normal activity and work as indicated by your physician. ° °Diet:  °Drink liquids today or eat a light diet.  You may resume a regular diet tomorrow.   ° °General expectations: °Pain for two to three days. °Fingers may become slightly swollen. ° °Call your doctor if any of the following occur: °Severe pain not relieved by pain medication. °Elevated temperature. °Dressing soaked with blood. °Inability to move fingers. °White or bluish color to fingers. ° ° °Post Anesthesia Home Care Instructions ° °Activity: °Get plenty of rest for the remainder of the day. A responsible adult should stay with you for 24 hours following the procedure.  °For the next 24 hours, DO NOT: °-Drive a car °-Operate machinery °-Drink alcoholic beverages °-Take any medication unless instructed by your physician °-Make any legal decisions or sign important papers. ° °Meals: °Start with liquid foods such as gelatin or soup. Progress to regular foods as tolerated. Avoid greasy, spicy, heavy foods. If nausea and/or vomiting occur, drink only clear liquids until the nausea and/or vomiting subsides. Call your physician if vomiting continues. ° °Special Instructions/Symptoms: °Your throat may feel dry or sore from the anesthesia or the breathing tube placed in your throat during surgery. If this causes discomfort, gargle with warm salt water. The discomfort should disappear within 24  hours. ° ° °Regional Anesthesia Blocks ° °1. Numbness or the inability to move the "blocked" extremity may last from 3-48 hours after placement. The length of time depends on the medication injected and your individual response to the medication. If the numbness is not going away after 48 hours, call your surgeon. ° °2. The extremity that is blocked will need to be protected until the numbness is gone and the  Strength has returned. Because you cannot feel it, you will need to take extra care to avoid injury. Because it may be weak, you may have difficulty moving it or using it. You may not know what position it is in without looking at it while the block is in effect. ° °3. For blocks in the legs and feet, returning to weight bearing and walking needs to be done carefully. You will need to wait until the numbness is entirely gone and the strength has returned. You should be able to move your leg and foot normally before you try and bear weight or walk. You will need someone to be with you when you first try to ensure you do not fall and possibly risk injury. ° °4. Bruising and tenderness at the needle site are common side effects and will resolve in a few days. ° °5. Persistent numbness or new problems with movement should be communicated to the surgeon or the [facility] ([phone])/ [facility] ([phone]). °

## 2013-11-15 NOTE — Anesthesia Preprocedure Evaluation (Addendum)
Anesthesia Evaluation  Patient identified by MRN, date of birth, ID band Patient awake    Reviewed: Allergy & Precautions, H&P , NPO status , Patient's Chart, lab work & pertinent test results  Airway Mallampati: II TM Distance: >3 FB Neck ROM: Full    Dental no notable dental hx. (+) Teeth Intact, Dental Advisory Given   Pulmonary neg pulmonary ROS,  breath sounds clear to auscultation  Pulmonary exam normal       Cardiovascular negative cardio ROS  Rhythm:Regular Rate:Normal     Neuro/Psych negative neurological ROS  negative psych ROS   GI/Hepatic negative GI ROS, Neg liver ROS,   Endo/Other  Hypothyroidism Morbid obesity  Renal/GU negative Renal ROS  negative genitourinary   Musculoskeletal   Abdominal   Peds  Hematology negative hematology ROS (+)   Anesthesia Other Findings   Reproductive/Obstetrics negative OB ROS                          Anesthesia Physical Anesthesia Plan  ASA: III  Anesthesia Plan: Regional and MAC   Post-op Pain Management:    Induction: Intravenous  Airway Management Planned: Mask  Additional Equipment:   Intra-op Plan:   Post-operative Plan:   Informed Consent: I have reviewed the patients History and Physical, chart, labs and discussed the procedure including the risks, benefits and alternatives for the proposed anesthesia with the patient or authorized representative who has indicated his/her understanding and acceptance.   Dental advisory given  Plan Discussed with: CRNA  Anesthesia Plan Comments:        Anesthesia Quick Evaluation

## 2013-11-15 NOTE — ED Notes (Signed)
Unable to locate pt in all waiting areas, apparently was overheard talking about going to [facility] instead.

## 2013-11-15 NOTE — Brief Op Note (Signed)
11/15/2013  11:25 AM  PATIENT:  Doris Romero  38 y.o. female  PRE-OPERATIVE DIAGNOSIS:  LEFT DISTAL RADIUS AND ULNAR FRACTURE   POST-OPERATIVE DIAGNOSIS:  LEFT DISTAL RADIUS AND ULNAR FRACTURE   PROCEDURE:  Procedure(s): OPEN REDUCTION INTERNAL FIXATION (ORIF) LEFT  DISTAL RADIUS  AND POSSIBLE PINNING VS ORIF ULNA    (Left)  SURGEON:  Surgeon(s) and Role:    * Tennis Must, MD - Primary  PHYSICIAN ASSISTANT:   ASSISTANTS: none   ANESTHESIA:   regional and sedation  EBL:  Total I/O In: 1000 [I.V.:1000] Out: -   BLOOD ADMINISTERED:none  DRAINS: none   LOCAL MEDICATIONS USED:  NONE  SPECIMEN:  No Specimen  DISPOSITION OF SPECIMEN:  N/A  COUNTS:  YES  TOURNIQUET:  97 minutes at 250 mmHg  DICTATION: .Other Dictation: Dictation Number [id number]  PLAN OF CARE: Discharge to home after PACU  PATIENT DISPOSITION:  PACU - hemodynamically stable.

## 2013-11-15 NOTE — Op Note (Signed)
Intra-operative fluoroscopic images in the AP, lateral, and oblique views were taken and evaluated by myself.  Reduction and hardware placement were confirmed.  There was no intraarticular penetration of permanent hardware.  

## 2013-11-15 NOTE — Anesthesia Procedure Notes (Signed)
Anesthesia Regional Block:  Supraclavicular block  Pre-Anesthetic Checklist: ,, timeout performed, Correct Patient, Correct Site, Correct Laterality, Correct Procedure, Correct Position, site marked, Risks and benefits discussed, pre-op evaluation, post-op pain management  Laterality: Left  Prep: Maximum Sterile Barrier Precautions used and chloraprep       Needles:  Injection technique: Single-shot  Needle Type: Echogenic Stimulator Needle     Needle Length: 5cm 5 cm Needle Gauge: 22 and 22 G    Additional Needles:  Procedures: ultrasound guided (picture in chart) Supraclavicular block Narrative:  Start time: 11/15/2013 8:33 AM End time: 11/15/2013 8:45 AM Injection made incrementally with aspirations every 5 mL. Anesthesiologist: Adesuwa Osgood,MD  Additional Notes: 2% Lidocaine skin wheel. Intercostobrachial block with 5cc of 0.5% Bupivicaine plain.

## 2013-11-15 NOTE — ED Notes (Signed)
Pt c/o left wrist pain. Pt wrist surgery today. Pt states left hand fingers are tingling, increase swelling and pain just in the last couple of hours. Pt able to move fingers. Cap refill <3sec. Pt states pain is unrelieved by home percocet.

## 2013-11-16 ENCOUNTER — Encounter (HOSPITAL_BASED_OUTPATIENT_CLINIC_OR_DEPARTMENT_OTHER): Payer: Self-pay | Admitting: Orthopedic Surgery

## 2013-11-16 ENCOUNTER — Telehealth: Payer: Self-pay | Admitting: Family Medicine

## 2013-11-16 MED ORDER — HYDROMORPHONE HCL PF 1 MG/ML IJ SOLN
1.0000 mg | Freq: Once | INTRAMUSCULAR | Status: AC
  Administered 2013-11-16: 1 mg via INTRAVENOUS
  Filled 2013-11-16: qty 1

## 2013-11-16 MED ORDER — HYDROMORPHONE HCL PF 1 MG/ML IJ SOLN: 0.5000 mg | Freq: Once | INTRAMUSCULAR | Status: DC

## 2013-11-16 MED ORDER — ONDANSETRON HCL 4 MG/2ML IJ SOLN
4.0000 mg | Freq: Once | INTRAMUSCULAR | Status: AC
  Administered 2013-11-16: 4 mg via INTRAVENOUS
  Filled 2013-11-16: qty 2

## 2013-11-16 MED ORDER — SODIUM CHLORIDE 0.9 % IV SOLN
INTRAVENOUS | Status: DC
Start: 2013-11-16 — End: 2013-11-16
  Administered 2013-11-16: 01:00:00 via INTRAVENOUS

## 2013-11-16 NOTE — Telephone Encounter (Signed)
noted 
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
## 2013-11-16 NOTE — ED Notes (Signed)
Pt verbalized understanding of discharge instructions.  Pt was alert and oriented x4.  Pt ambulated at discharge.

## 2013-11-16 NOTE — Discharge Instructions (Signed)
Scaphoid Fracture, Wrist   Wear your splint and sling as directed. Take Percocet as prescribed. Follow up with your hand surgeon.  A fracture is a break in the bone. The bone you have broken often does not show up as a fracture on x-ray until later on in the healing phase. This bone is called the scaphoid bone. With this bone, your caregiver will often cast or splint your wrist as though it is fractured, even if a fracture is not seen on the x-ray. This is often done with wrist injuries in which there is tenderness at the base of the thumb. An x-ray at 1-3 weeks after your injury may confirm this fracture. A cast or splint is used to protect and keep your injured bone in good position for healing. The cast or splint will be on generally for about 6 to 16 weeks, depending on your health, age, the fracture location and how quickly you heal. Another name for the scaphoid bone is the navicular bone.  HOME CARE INSTRUCTIONS  To lessen the swelling and pain, keep the injured part elevated above your heart while sitting or lying down.  Apply ice to the injury for 15-20 minutes, 03-04 times per day while awake, for 2 days. Put the ice in a plastic bag and place a thin towel between the bag of ice and your cast.  If you have a plaster or fiberglass cast or splint:  Do not try to scratch the skin under the cast using sharp or pointed objects.  Check the skin around the cast every day. You may put lotion on any red or sore areas.  Keep your cast or splint dry and clean.  If you have a plaster splint:  Wear the splint as directed.  You may loosen the elastic bandage around the splint if your fingers become numb, tingle, or turn cold or blue.  If you have been put in a removable splint, wear and use as directed.  Do not put pressure on any part of your cast or splint; it may deform or break. Rest your cast or splint only on a pillow the first 24 hours until it is fully hardened.  Your cast or splint can be  protected during bathing with a plastic bag. Do not lower the cast or splint into water.  Only take over-the-counter or prescription medicines for pain, discomfort, or fever as directed by your caregiver.  If your caregiver has given you a follow up appointment, it is very important to keep that appointment. Not keeping the appointment could result in chronic pain and decreased function. If there is any problem keeping the appointment, you must call back to this facility for assistance. SEEK IMMEDIATE MEDICAL CARE IF:  Your cast gets damaged, wet or breaks.  You have continued severe pain or more swelling than you did before the cast or splint was put on.  Your skin or nails below the injury turn blue or gray, or feel cold or numb.  You have tingling or burning pain in your fingers or increasing pain with movement of your fingers Document Released: 06/04/2002 Document Revised: 09/06/2011 Document Reviewed: 01/31/2009  [facility] Patient Information 2014 [place].

## 2013-11-16 NOTE — ED Notes (Signed)
Pt's arm rewrapped with ace wrap.
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
## 2013-11-16 NOTE — ED Notes (Signed)
Pt st's she had ORIF of left wrist earlier today.  Pt c/o pain and swelling to arm.  Fingers are warm, cap refill <2 seconds.  Pt st's pain unrelieved with Percocet at home.

## 2013-12-28 ENCOUNTER — Other Ambulatory Visit: Payer: Self-pay | Admitting: Family Medicine

## 2014-01-16 ENCOUNTER — Other Ambulatory Visit: Payer: Self-pay | Admitting: Family Medicine

## 2014-01-16 ENCOUNTER — Ambulatory Visit (INDEPENDENT_AMBULATORY_CARE_PROVIDER_SITE_OTHER): Payer: 59 | Admitting: Family Medicine

## 2014-01-16 ENCOUNTER — Encounter: Payer: Self-pay | Admitting: Family Medicine

## 2014-01-16 VITALS — BP 126/72 | HR 100 | Resp 20 | Ht 65.0 in | Wt 246.0 lb

## 2014-01-16 DIAGNOSIS — Z862 Personal history of diseases of the blood and blood-forming organs and certain disorders involving the immune mechanism: Secondary | ICD-10-CM

## 2014-01-16 DIAGNOSIS — E8881 Metabolic syndrome: Secondary | ICD-10-CM

## 2014-01-16 DIAGNOSIS — E669 Obesity, unspecified: Secondary | ICD-10-CM

## 2014-01-16 DIAGNOSIS — R7301 Impaired fasting glucose: Secondary | ICD-10-CM

## 2014-01-16 DIAGNOSIS — E890 Postprocedural hypothyroidism: Secondary | ICD-10-CM

## 2014-01-16 DIAGNOSIS — E785 Hyperlipidemia, unspecified: Secondary | ICD-10-CM

## 2014-01-16 LAB — COMPREHENSIVE METABOLIC PANEL
ALBUMIN: 4.1 g/dL (ref 3.5–5.2)
ALK PHOS: 42 U/L (ref 39–117)
ALT: <8 U/L (ref 0–35)
AST: 13 U/L (ref 0–37)
BUN: 13 mg/dL (ref 6–23)
CO2: 26 mEq/L (ref 19–32)
Calcium: 8.7 mg/dL (ref 8.4–10.5)
Chloride: 103 mEq/L (ref 96–112)
Creat: 0.66 mg/dL (ref 0.50–1.10)
GLUCOSE: 90 mg/dL (ref 70–99)
POTASSIUM: 4.3 meq/L (ref 3.5–5.3)
Sodium: 136 mEq/L (ref 135–145)
TOTAL PROTEIN: 7.6 g/dL (ref 6.0–8.3)
Total Bilirubin: 0.2 mg/dL (ref 0.2–1.2)

## 2014-01-16 LAB — LIPID PANEL
CHOL/HDL RATIO: 3.2 ratio
Cholesterol: 159 mg/dL (ref 0–200)
HDL: 49 mg/dL (ref >39)
LDL CALC: 96 mg/dL (ref 0–99)
TRIGLYCERIDES: 70 mg/dL (ref <150)
VLDL: 14 mg/dL (ref 0–40)

## 2014-01-16 LAB — CBC
HCT: 32 % — ABNORMAL LOW (ref 36.0–46.0)
HEMOGLOBIN: 10.2 g/dL — AB (ref 12.0–15.0)
MCH: 23.4 pg — AB (ref 26.0–34.0)
MCHC: 31.9 g/dL (ref 30.0–36.0)
MCV: 73.6 fL — ABNORMAL LOW (ref 78.0–100.0)
Platelets: 362 10*3/uL (ref 150–400)
RBC: 4.35 MIL/uL (ref 3.87–5.11)
RDW: 15.4 % (ref 11.5–15.5)
WBC: 3.9 10*3/uL — ABNORMAL LOW (ref 4.0–10.5)

## 2014-01-16 LAB — TSH: TSH: 2.136 u[IU]/mL (ref 0.350–4.500)
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
## 2014-01-17 LAB — HEMOGLOBIN A1C
Hgb A1c MFr Bld: 6.1 % — ABNORMAL HIGH (ref <5.7)
Mean Plasma Glucose: 128 mg/dL — ABNORMAL HIGH (ref <117)

## 2014-01-17 LAB — FERRITIN: FERRITIN: 7 ng/mL — AB (ref 10–291)

## 2014-01-17 LAB — IRON: Iron: 16 ug/dL — ABNORMAL LOW (ref 42–145)

## 2014-01-17 MED ORDER — PHENTERMINE HCL 37.5 MG PO TABS: 37.5000 mg | ORAL_TABLET | Freq: Every day | ORAL | Status: DC

## 2014-01-19 DIAGNOSIS — S5292XA Unspecified fracture of left forearm, initial encounter for closed fracture: Secondary | ICD-10-CM | POA: Insufficient documentation

## 2014-01-19 NOTE — Assessment & Plan Note (Signed)
Improved. Pt applauded on succesful weight loss through lifestyle change, and encouraged to continue same. Weight loss goal set for the next several months. Pt tpo resume phentermine

## 2014-01-19 NOTE — Assessment & Plan Note (Signed)
Pt needs to comply with  daily iron supplement

## 2014-01-19 NOTE — Assessment & Plan Note (Signed)
Controlled, continue current medication 

## 2014-01-19 NOTE — Assessment & Plan Note (Signed)
Markedly improved, and normalized with correction of hypothyroid sate

## 2014-03-22 ENCOUNTER — Other Ambulatory Visit: Payer: Self-pay

## 2014-03-22 MED ORDER — LEVOTHYROXINE SODIUM 150 MCG PO TABS: ORAL_TABLET | ORAL | Status: DC

## 2014-04-01 ENCOUNTER — Telehealth: Payer: Self-pay

## 2014-04-01 NOTE — Telephone Encounter (Signed)
Will bring form by the office.

## 2014-04-01 NOTE — Telephone Encounter (Signed)
Please advise.  Is patient without restrictions or does this need to come from another source?
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
## 2014-04-01 NOTE — Telephone Encounter (Signed)
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

## 2014-04-30 ENCOUNTER — Ambulatory Visit (INDEPENDENT_AMBULATORY_CARE_PROVIDER_SITE_OTHER): Payer: 59 | Admitting: Family Medicine

## 2014-04-30 ENCOUNTER — Encounter (INDEPENDENT_AMBULATORY_CARE_PROVIDER_SITE_OTHER): Payer: Self-pay

## 2014-04-30 ENCOUNTER — Encounter: Payer: Self-pay | Admitting: Family Medicine

## 2014-04-30 VITALS — BP 122/88 | HR 94 | Resp 16 | Ht 65.0 in | Wt 241.0 lb

## 2014-04-30 DIAGNOSIS — IMO0001 Reserved for inherently not codable concepts without codable children: Secondary | ICD-10-CM

## 2014-04-30 DIAGNOSIS — E890 Postprocedural hypothyroidism: Secondary | ICD-10-CM

## 2014-04-30 DIAGNOSIS — N3000 Acute cystitis without hematuria: Secondary | ICD-10-CM | POA: Insufficient documentation

## 2014-04-30 DIAGNOSIS — D509 Iron deficiency anemia, unspecified: Secondary | ICD-10-CM

## 2014-04-30 DIAGNOSIS — R7302 Impaired glucose tolerance (oral): Secondary | ICD-10-CM

## 2014-04-30 DIAGNOSIS — E785 Hyperlipidemia, unspecified: Secondary | ICD-10-CM

## 2014-04-30 DIAGNOSIS — E8881 Metabolic syndrome: Secondary | ICD-10-CM

## 2014-04-30 LAB — CBC WITH DIFFERENTIAL/PLATELET
BASOS ABS: 0 10*3/uL (ref 0.0–0.1)
Basophils Relative: 0 % (ref 0–1)
Eosinophils Absolute: 0.1 10*3/uL (ref 0.0–0.7)
Eosinophils Relative: 2 % (ref 0–5)
HCT: 31.4 % — ABNORMAL LOW (ref 36.0–46.0)
Hemoglobin: 9.9 g/dL — ABNORMAL LOW (ref 12.0–15.0)
Lymphocytes Relative: 43 % (ref 12–46)
Lymphs Abs: 2.1 10*3/uL (ref 0.7–4.0)
MCH: 22.8 pg — ABNORMAL LOW (ref 26.0–34.0)
MCHC: 31.5 g/dL (ref 30.0–36.0)
MCV: 72.2 fL — ABNORMAL LOW (ref 78.0–100.0)
Monocytes Absolute: 0.6 10*3/uL (ref 0.1–1.0)
Monocytes Relative: 13 % — ABNORMAL HIGH (ref 3–12)
NEUTROS ABS: 2 10*3/uL (ref 1.7–7.7)
Neutrophils Relative %: 42 % — ABNORMAL LOW (ref 43–77)
PLATELETS: 315 10*3/uL (ref 150–400)
RBC: 4.35 MIL/uL (ref 3.87–5.11)
RDW: 16.9 % — AB (ref 11.5–15.5)
WBC: 4.8 10*3/uL (ref 4.0–10.5)

## 2014-04-30 LAB — POCT URINALYSIS DIPSTICK
BILIRUBIN UA: NEGATIVE
Glucose, UA: NEGATIVE
Ketones, UA: NEGATIVE
NITRITE UA: POSITIVE
Protein, UA: NEGATIVE
RBC UA: NEGATIVE
Spec Grav, UA: 1.02
Urobilinogen, UA: 0.2
pH, UA: 7

## 2014-04-30 MED ORDER — LEVOFLOXACIN 500 MG PO TABS: 500.0000 mg | ORAL_TABLET | Freq: Every day | ORAL | Status: DC

## 2014-04-30 MED ORDER — PHENTERMINE HCL 37.5 MG PO TABS: 37.5000 mg | ORAL_TABLET | Freq: Every day | ORAL | Status: DC

## 2014-04-30 NOTE — Assessment & Plan Note (Signed)
The increased risk of cardiovascular disease associated with this diagnosis, and the need to consistently work on lifestyle to change this is discussed. Following  a  heart healthy diet ,commitment to 30 minutes of exercise at least 5 days per week, as well as control of blood sugar and cholesterol , and achieving a healthy weight are all the areas to be addressed .  

## 2014-04-30 NOTE — Assessment & Plan Note (Signed)
Non compliant with supplement , updated lab today

## 2014-04-30 NOTE — Assessment & Plan Note (Signed)
Updated lab needed today for assessment of control

## 2014-04-30 NOTE — Assessment & Plan Note (Signed)
Hyperlipidemia:Low fat diet discussed and encouraged.  Updated lab needed at/ before next visit.  

## 2014-04-30 NOTE — Assessment & Plan Note (Signed)
Updated lab needed today Patient educated about the importance of limiting  Carbohydrate intake , the need to commit to daily physical activity for a minimum of 30 minutes , and to commit weight loss. The fact that changes in all these areas will reduce or eliminate all together the development of diabetes is stressed.

## 2014-04-30 NOTE — Patient Instructions (Addendum)
F/u in 4 month, call if you need me before  CBc , iron and ferritin, TSh, HBA1C today  Letter for flu vaccine today    It is important that you exercise regularly at least 30 minutes 5 times a week. If you develop chest pain, have severe difficulty breathing, or feel very tired, stop exercising immediately and seek medical attention   You are PREDIABETIC, need top work aggressively on weight loss to delay or prevent the disease   Levaquin sent in for UTI

## 2014-04-30 NOTE — Progress Notes (Signed)
   Subjective:    Patient ID: Doris Romero, female    DOB: 1975-08-24, 38 y.o.   MRN: [id number]  HPI The PT is here for follow up and re-evaluation of chronic medical conditions, medication management and review of any available recent lab and radiology data.  Preventive health is updated, specifically  Cancer screening and Immunization.   Has returned to work and has at [place] 80 % function in left hand in my estimation which is wonderful Needs note for work exempting her from flu vaccine due to allergy. The PT denies any adverse reactions to current medications since the last visit.  1 week h/o malodorous urine and no fever ,chills  , flank pain or dysuria    Review of Systems .See HPI Denies recent fever or chills. Denies sinus pressure, nasal congestion, ear pain or sore throat. Denies chest congestion, productive cough or wheezing. Denies chest pains, palpitations and leg swelling Denies abdominal pain, nausea, vomiting,diarrhea or constipation.     Denies headaches, seizures, numbness, or tingling. Denies depression, anxiety or insomnia. Denies skin break down or rash.        Objective:   Physical Exam  BP 122/88 mmHg  Pulse 94  Resp 16  Ht 5\' 5"  (1.651 m)  Wt 241 lb (109.317 kg)  BMI 40.10 kg/m2  SpO2 98% Patient alert and oriented and in no cardiopulmonary distress.  HEENT: No facial asymmetry, EOMI,   oropharynx pink and moist.  Neck supple no JVD, no mass.  Chest: Clear to auscultation bilaterally.  CVS: S1, S2 no murmurs, no S3.Regular rate.  ABD: Soft non tender.   Ext: No edema  MS: Adequate ROM spine, shoulders, hips and knees.decreased ROM and strength in left hand   Skin: Intact, no ulcerations or rash noted.  Psych: Good eye contact, normal affect. Memory intact not anxious or depressed appearing.  CNS: CN 2-12 intact,       Assessment & Plan:  Hypothyroidism, postradioiodine therapy Updated lab needed today for assessment of  control  IGT (impaired glucose tolerance) Updated lab needed today Patient educated about the importance of limiting  Carbohydrate intake , the need to commit to daily physical activity for a minimum of 30 minutes , and to commit weight loss. The fact that changes in all these areas will reduce or eliminate all together the development of diabetes is stressed.     Acute cystitis 3 day antibiotic course prescribed  Dyslipidemia Hyperlipidemia:Low fat diet discussed and encouraged.  Updated lab needed at/ before next visit.   Metabolic syndrome X The increased risk of cardiovascular disease associated with this diagnosis, and the need to consistently work on lifestyle to change this is discussed. Following  a  heart healthy diet ,commitment to 30 minutes of exercise at least 5 days per week, as well as control of blood sugar and cholesterol , and achieving a healthy weight are all the areas to be addressed .\  Obesity, Class II, BMI 35-39.9, with comorbidity Improved. Pt applauded on succesful weight loss through lifestyle change, and encouraged to continue same. Weight loss goal set for the next several months.    Iron deficiency anemia Non compliant with supplement , updated lab today

## 2014-04-30 NOTE — Assessment & Plan Note (Signed)
Improved. Pt applauded on succesful weight loss through lifestyle change, and encouraged to continue same. Weight loss goal set for the next several months.  

## 2014-04-30 NOTE — Assessment & Plan Note (Signed)
3 day antibiotic course prescribed

## 2014-05-01 LAB — HEMOGLOBIN A1C
Hgb A1c MFr Bld: 6.2 % — ABNORMAL HIGH (ref <5.7)
Mean Plasma Glucose: 131 mg/dL — ABNORMAL HIGH (ref <117)

## 2014-05-01 LAB — FERRITIN: FERRITIN: 6 ng/mL — AB (ref 10–291)

## 2014-05-01 LAB — TSH: TSH: 3.426 u[IU]/mL (ref 0.350–4.500)

## 2014-05-01 LAB — IRON: IRON: 34 ug/dL — AB (ref 42–145)

## 2014-05-03 LAB — URINE CULTURE

## 2014-05-03 NOTE — Addendum Note (Signed)
Addended by: Denman George B on: 05/03/2014 01:15 PM   Modules accepted: Orders

## 2014-07-02 ENCOUNTER — Telehealth: Payer: Self-pay | Admitting: Family Medicine

## 2014-07-02 ENCOUNTER — Other Ambulatory Visit: Payer: Self-pay

## 2014-07-02 ENCOUNTER — Telehealth: Payer: Self-pay | Admitting: *Deleted

## 2014-07-02 MED ORDER — PHENTERMINE HCL 37.5 MG PO TABS: 37.5000 mg | ORAL_TABLET | Freq: Every day | ORAL | Status: DC

## 2014-07-02 MED ORDER — LEVOTHYROXINE SODIUM 150 MCG PO TABS: ORAL_TABLET | ORAL | Status: DC

## 2014-07-02 NOTE — Telephone Encounter (Signed)
Med refilled.

## 2014-07-02 NOTE — Telephone Encounter (Signed)
Do you agree with refilling med

## 2014-07-02 NOTE — Telephone Encounter (Addendum)
Call need to start with half tab daily , needs start weight needs to keep f/u appt in 3 months, with 4 pound per month weight loss

## 2014-07-02 NOTE — Telephone Encounter (Signed)
Pt called [facility] stating she needs her med refill and also pt wants to know if coming in Friday morning to start her weight will be okay to do. Please advise

## 2014-07-02 NOTE — Telephone Encounter (Signed)
Called and left message for patient to come in for weight check prior to starting Phentermine.  Will await return call or for patient to stop by office before sending in rx.

## 2014-07-02 NOTE — Telephone Encounter (Signed)
Left pt a message that coming fri is fine and she would get script then and to call her pharmacy if she needed any other refills

## 2014-07-02 NOTE — Addendum Note (Signed)
Addended by: Denman George B on: 07/02/2014 11:57 AM   Modules accepted: Orders

## 2014-09-02 IMAGING — CT CT WRIST*L* W/O CM
3 series · 9 of 33 positions shown, 10 images · non-contrast
Comparison: Radiographs same date.

CLINICAL DATA: Displaced intra-articular fractures of the distal
radius and ulna status post reduction. Evaluate scaphoid.

EXAM:
CT OF THE LEFT WRIST WITHOUT CONTRAST
TECHNIQUE: Multidetector CT imaging was performed according to the standard
protocol. Multiplanar CT image reconstructions were also generated.

[Series 202: soft tissue · axial · 0.41mm/px · z∈[+174,+174]mm · 1 of 71 slices shown, 2 images]
[im 38/71  soft-tissue]
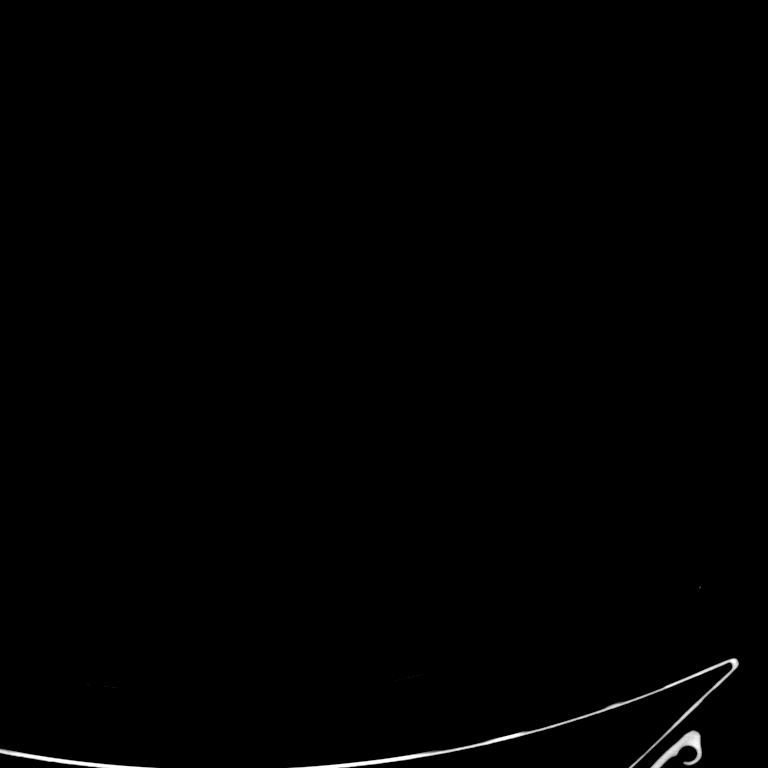
[im 38/71  bone]
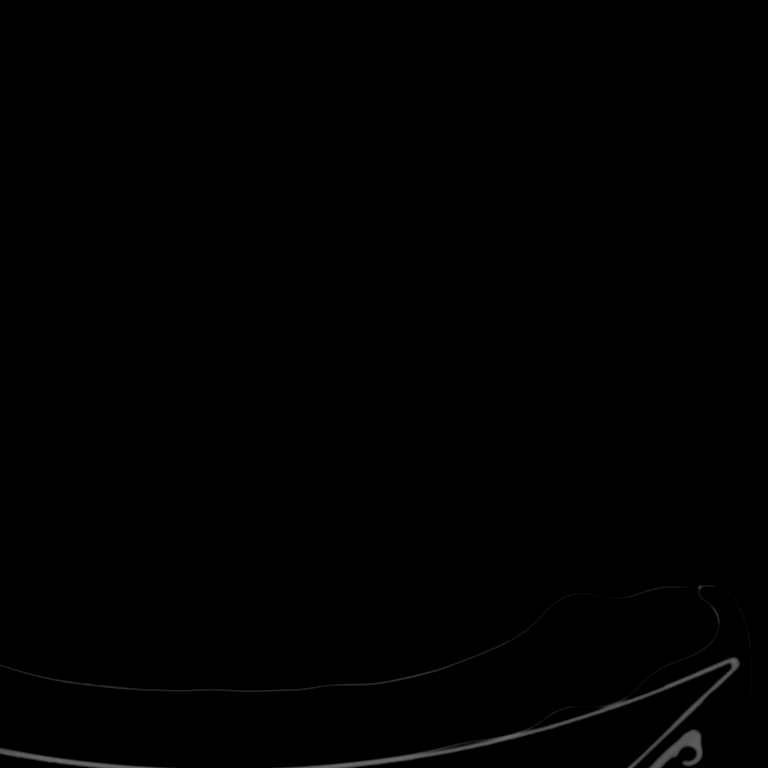

[Series 203: coronals · coronal · 0.34mm/px · 3 of 122 slices shown]
[im 25/122  bone]
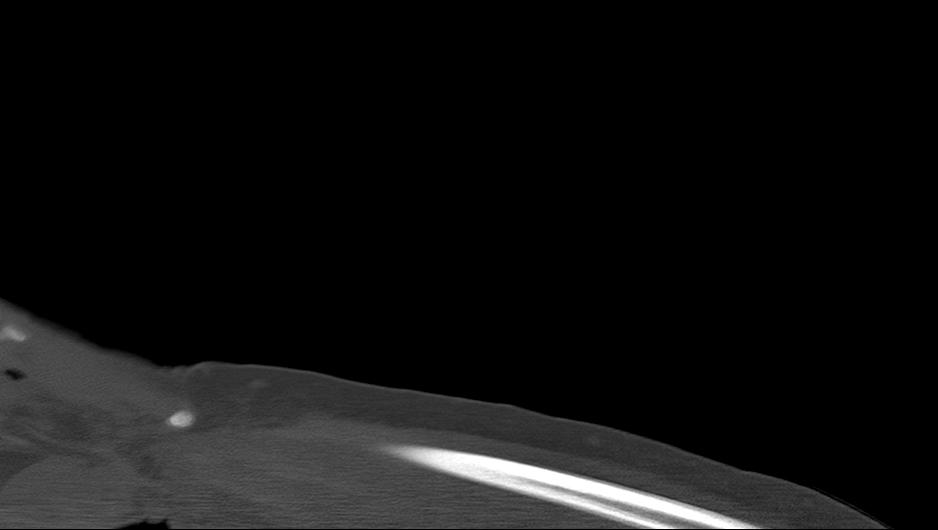
[im 49/122  bone]
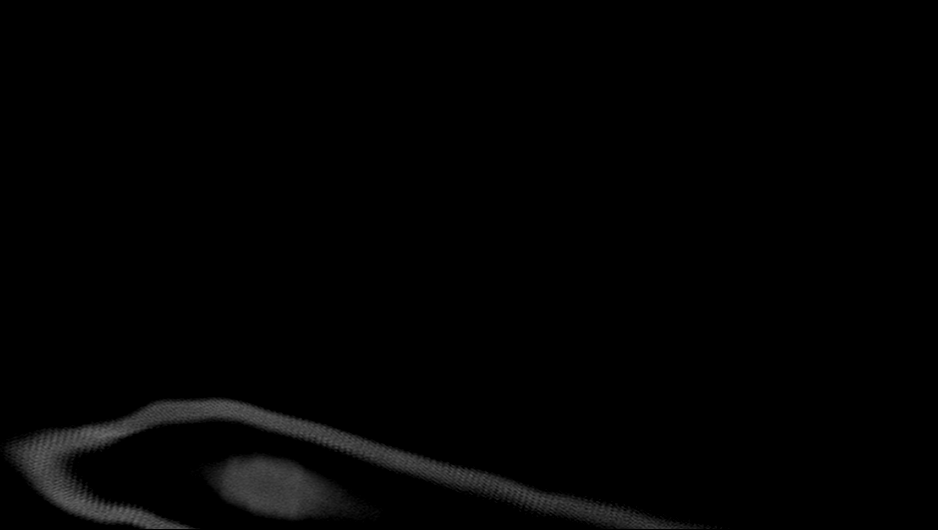
[im 73/122  bone]
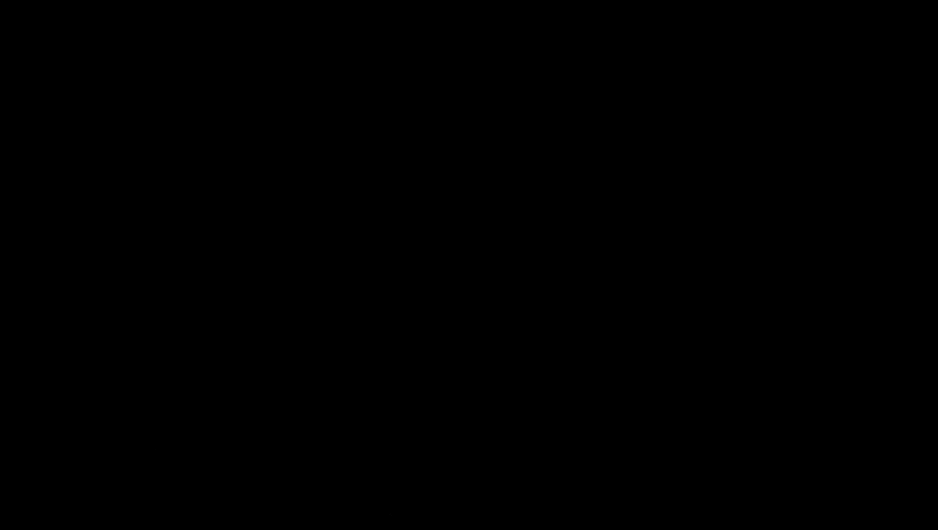

[Series 204: sagittals · sagittal · 0.34mm/px · 5 of 155 slices shown]
[im 52/155  bone]
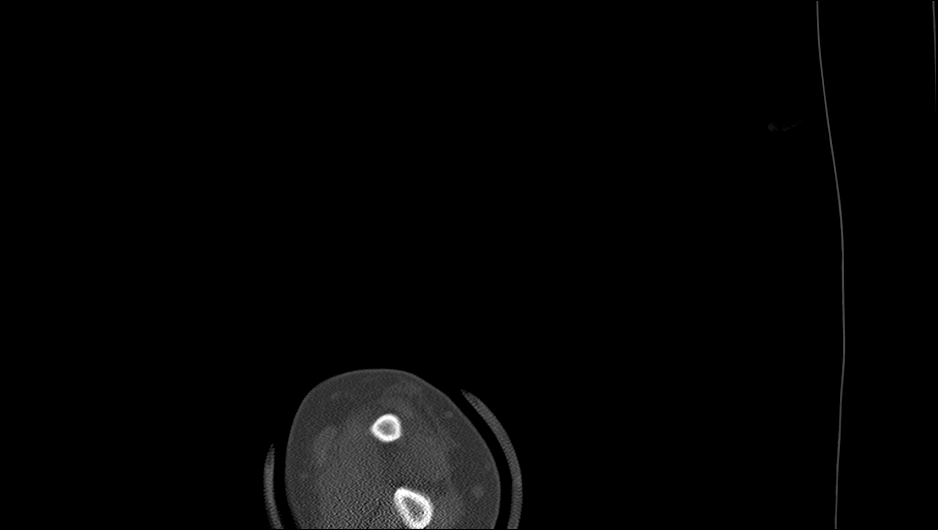
[im 65/155  bone]
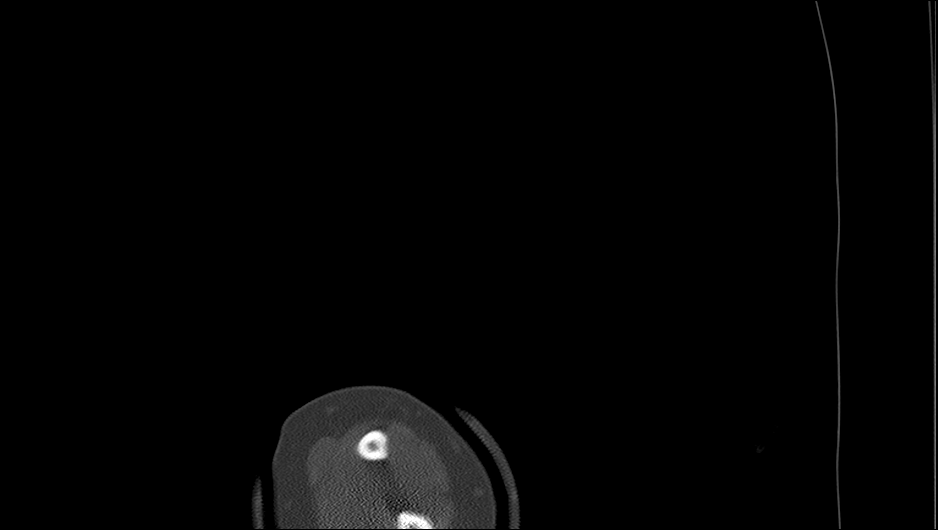
[im 78/155  bone]
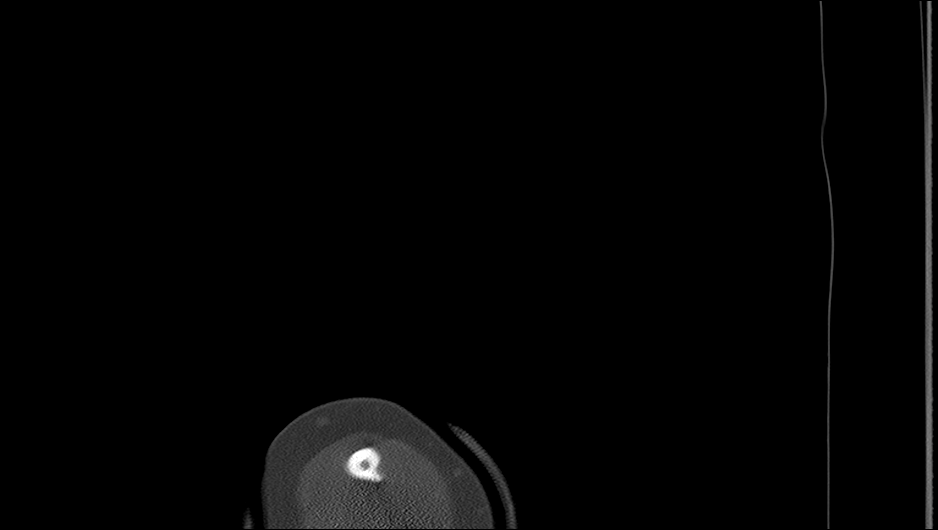
[im 90/155  bone]
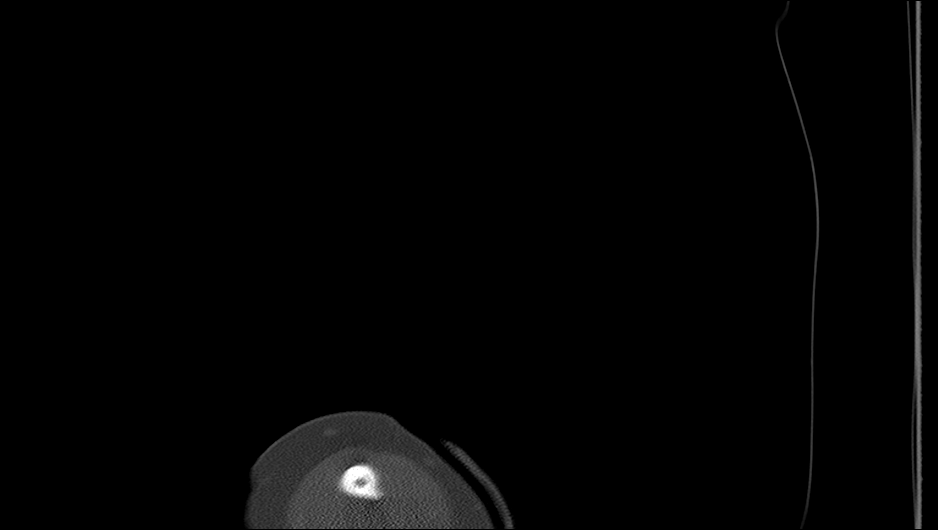
[im 103/155  bone]
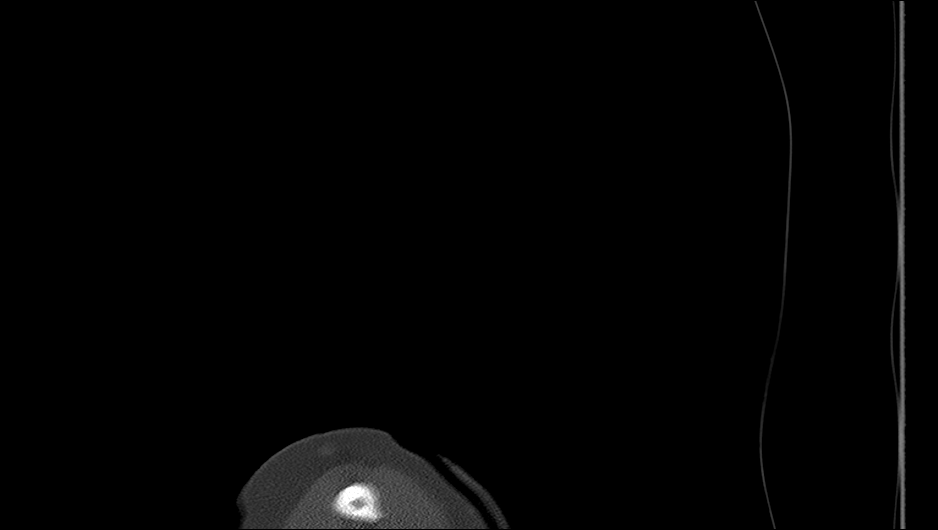

[9 of 33 positions shown; findings below may reference images not displayed]

FINDINGS: Examination is mildly limited by motion and large field-of-view.
There is improved alignment of the comminuted intra-articular
fracture of the distal radius. There is 6 mm of cortical
displacement along the ulnar aspect of the radial metaphysis. This
is proximal to the articulation with the distal ulna. There are
nondisplaced fractures involving the distal articular surface and
sigmoid notch of the radius.

Ulnar styloid fracture demonstrates near anatomic reduction.

No carpal bone fractures are demonstrated. Specifically, the
scaphoid and hamate hook appear intact. The alignment of the carpal
bones is anatomic.

There is soft tissue swelling around the wrist with a moderate size
radiocarpal joint effusion. Soft tissue emphysema is present within
the carpal tunnel, extending along the volar aspect of the
metacarpals.
IMPRESSION: 1. Near anatomic reduction of fractures of the distal radius and
ulna. No significant articular surface irregularity of the distal
radius or sigmoid notch is seen. There is still some displacement of
the radial metaphysis.
2. No evidence of carpal bone fracture or subluxation.
Intra-articular detail is limited by motion and field-of-view.
3. Soft tissue emphysema within the volar aspect of the hand
suggesting open fractures.

## 2014-09-09 ENCOUNTER — Ambulatory Visit (INDEPENDENT_AMBULATORY_CARE_PROVIDER_SITE_OTHER): Payer: 59 | Admitting: Family Medicine

## 2014-09-09 ENCOUNTER — Encounter: Payer: Self-pay | Admitting: Family Medicine

## 2014-09-09 VITALS — BP 122/90 | HR 79 | Resp 16 | Ht 65.0 in | Wt 239.0 lb

## 2014-09-09 DIAGNOSIS — Z91199 Patient's noncompliance with other medical treatment and regimen due to unspecified reason: Secondary | ICD-10-CM

## 2014-09-09 DIAGNOSIS — E8881 Metabolic syndrome: Secondary | ICD-10-CM

## 2014-09-09 DIAGNOSIS — E785 Hyperlipidemia, unspecified: Secondary | ICD-10-CM

## 2014-09-09 DIAGNOSIS — R7302 Impaired glucose tolerance (oral): Secondary | ICD-10-CM

## 2014-09-09 DIAGNOSIS — Z9119 Patient's noncompliance with other medical treatment and regimen: Secondary | ICD-10-CM

## 2014-09-09 DIAGNOSIS — D509 Iron deficiency anemia, unspecified: Secondary | ICD-10-CM

## 2014-09-09 DIAGNOSIS — Z1159 Encounter for screening for other viral diseases: Secondary | ICD-10-CM | POA: Diagnosis not present

## 2014-09-09 DIAGNOSIS — E890 Postprocedural hypothyroidism: Secondary | ICD-10-CM | POA: Diagnosis not present

## 2014-09-09 MED ORDER — PHENTERMINE HCL 37.5 MG PO TABS: 37.5000 mg | ORAL_TABLET | Freq: Every day | ORAL | Status: DC

## 2014-09-09 MED ORDER — LEVOTHYROXINE SODIUM 150 MCG PO TABS: ORAL_TABLET | ORAL | Status: DC

## 2014-09-09 NOTE — Progress Notes (Signed)
Subjective:    Patient ID: Doris Romero, female    DOB: 1975-09-05, 39 y.o.   MRN: [id number]  HPI The PT is here for follow up and re-evaluation of chronic medical conditions, medication management and review of any available recent lab and radiology data. No labs available and are due Preventive health is updated, specifically  Cancer screening and Immunization.   The PT denies any adverse reactions to current medications since the last visit.  There are no new concerns. Continues to be concerned about her weight but is inconsistent in her behavior to address this, weight is unchanged  There are no specific complaints       Review of Systems See HPI Denies recent fever or chills. Denies sinus pressure, nasal congestion, ear pain or sore throat. Denies chest congestion, productive cough or wheezing. Denies chest pains, palpitations and leg swelling Denies abdominal pain, nausea, vomiting,diarrhea or constipation.   Denies dysuria, frequency, hesitancy or incontinence. Denies joint pain, swelling and limitation in mobility. Denies headaches, seizures, numbness, or tingling. Denies depression, anxiety or insomnia. Denies skin break down or rash.        Objective:   Physical Exam   BP 122/90 mmHg  Pulse 79  Resp 16  Ht 5\' 5"  (1.651 m)  Wt 239 lb (108.41 kg)  BMI 39.77 kg/m2  SpO2 100% Patient alert and oriented and in no cardiopulmonary distress.  HEENT: No facial asymmetry, EOMI,   oropharynx pink and moist.  Neck supple no JVD, no mass.  Chest: Clear to auscultation bilaterally.  CVS: S1, S2 no murmurs, no S3.Regular rate.  ABD: Soft non tender.   Ext: No edema  MS: Adequate ROM spine, shoulders, hips and knees.  Skin: Intact, no ulcerations or rash noted.  Psych: Good eye contact, normal affect. Memory intact not anxious or depressed appearing.  CNS: CN 2-12 intact, power,  normal throughout.no focal deficits noted.      Assessment & Plan:  IGT  (impaired glucose tolerance) Patient educated about the importance of limiting  Carbohydrate intake , the need to commit to daily physical activity for a minimum of 30 minutes , and to commit weight loss. The fact that changes in all these areas will reduce or eliminate all together the development of diabetes is stressed.  Updated lab needed t.   Diabetic Labs Latest Ref Rng 04/30/2014 01/16/2014 11/08/2013 09/26/2013 06/05/2012  HbA1c <5.7 % 6.2(H) 6.1(H) - 6.3(H) 6.2(H)  Chol 0 - 200 mg/dL - 159 - 261(H) -  HDL >39 mg/dL - 49 - 60 -  Calc LDL 0 - 99 mg/dL - 96 - 186(H) -  Triglycerides <150 mg/dL - 70 - 73 -  Creatinine 0.50 - 1.10 mg/dL - 0.66 0.73 1.17(H) 0.46(L)   BP/Weight 09/09/2014 04/30/2014 01/16/2014 11/16/2013 11/15/2013 11/15/2013 9/44/9675  Systolic BP 916 384 665 993 - 570 -  Diastolic BP 90 88 72 79 - 72 -  Wt. (Lbs) 239 241 246 - 257 - 253  BMI 39.77 40.1 40.94 - 41.5 - 40.85   No flowsheet data found.      Hypothyroidism, postradioiodine therapy Updated lab needed     Morbid obesity Unchanged, needs to be consistent with lifestyle modification Patient re-educated about  the importance of commitment to a  minimum of 150 minutes of exercise per week.  The importance of healthy food choices with portion control discussed. Encouraged to start a food diary, count calories and to consider  joining a support group. Sample diet  sheets offered. Goals set by the patient for the next several months.   Weight /BMI 09/09/2014 04/30/2014 01/16/2014  WEIGHT 239 lb 241 lb 246 lb  HEIGHT 5\' 5"  5\' 5"  5\' 5"   BMI 39.77 kg/m2 40.1 kg/m2 40.94 kg/m2    Current exercise per week 90 minutes.    Metabolic syndrome X The increased risk of cardiovascular disease associated with this diagnosis, and the need to consistently work on lifestyle to change this is discussed. Following  a  heart healthy diet ,commitment to 30 minutes of exercise at least 5 days per week, as well as control of  blood sugar and cholesterol , and achieving a healthy weight are all the areas to be addressed .    Dyslipidemia Hyperlipidemia:Low fat diet discussed and encouraged.   Lipid Panel  Lab Results  Component Value Date   CHOL 159 01/16/2014   HDL 49 01/16/2014   LDLCALC 96 01/16/2014   TRIG 70 01/16/2014   CHOLHDL 3.2 01/16/2014      Updated lab needed at/ before next visit.    Iron deficiency anemia Updated lab needed     Medical non-compliance Pt has not had labs requested and understands the importance of keeping up with labs, will attempt re education and responsible behavior as far as health is concerned

## 2014-09-09 NOTE — Patient Instructions (Signed)
F/u in 4 month, call if you need me before  Labs needed as soon as possible It is important that you exercise regularly at least 30 minutes 5 times a week. If you develop chest pain, have severe difficulty breathing, or feel very tired, stop exercising immediately and seek medical attention    Commit to low carb  Diet , rich in fruit and vegetable unprocessed

## 2014-09-30 ENCOUNTER — Other Ambulatory Visit: Payer: Self-pay

## 2014-09-30 MED ORDER — LEVOTHYROXINE SODIUM 150 MCG PO TABS: ORAL_TABLET | ORAL | Status: DC

## 2014-09-30 MED ORDER — PHENTERMINE HCL 37.5 MG PO TABS: 37.5000 mg | ORAL_TABLET | Freq: Every day | ORAL | Status: DC

## 2014-11-05 ENCOUNTER — Telehealth: Payer: Self-pay | Admitting: Family Medicine

## 2014-11-05 DIAGNOSIS — Z91199 Patient's noncompliance with other medical treatment and regimen due to unspecified reason: Secondary | ICD-10-CM | POA: Insufficient documentation

## 2014-11-05 DIAGNOSIS — Z9119 Patient's noncompliance with other medical treatment and regimen: Secondary | ICD-10-CM | POA: Insufficient documentation

## 2014-11-05 NOTE — Assessment & Plan Note (Signed)
The increased risk of cardiovascular disease associated with this diagnosis, and the need to consistently work on lifestyle to change this is discussed. Following  a  heart healthy diet ,commitment to 30 minutes of exercise at least 5 days per week, as well as control of blood sugar and cholesterol , and achieving a healthy weight are all the areas to be addressed .  

## 2014-11-05 NOTE — Assessment & Plan Note (Signed)
Pt has not had labs requested and understands the importance of keeping up with labs, will attempt re education and responsible behavior as far as health is concerned

## 2014-11-05 NOTE — Assessment & Plan Note (Signed)
Hyperlipidemia:Low fat diet discussed and encouraged.   Lipid Panel  Lab Results  Component Value Date   CHOL 159 01/16/2014   HDL 49 01/16/2014   LDLCALC 96 01/16/2014   TRIG 70 01/16/2014   CHOLHDL 3.2 01/16/2014      Updated lab needed at/ before next visit.

## 2014-11-05 NOTE — Assessment & Plan Note (Addendum)
Updated lab needed.  

## 2014-11-05 NOTE — Assessment & Plan Note (Signed)
Updated lab needed.  

## 2014-11-05 NOTE — Telephone Encounter (Signed)
Pls contact pt, still has not had labs ordered in March she is on phentermine and thyroid needs to be monitored etc. Recently I signed a form for her BUT she still had not had updated lab. I tried to call her today but na answer  IMPT pls

## 2014-11-05 NOTE — Assessment & Plan Note (Signed)
Unchanged, needs to be consistent with lifestyle modification Patient re-educated about  the importance of commitment to a  minimum of 150 minutes of exercise per week.  The importance of healthy food choices with portion control discussed. Encouraged to start a food diary, count calories and to consider  joining a support group. Sample diet sheets offered. Goals set by the patient for the next several months.   Weight /BMI 09/09/2014 04/30/2014 01/16/2014  WEIGHT 239 lb 241 lb 246 lb  HEIGHT 5\' 5"  5\' 5"  5\' 5"   BMI 39.77 kg/m2 40.1 kg/m2 40.94 kg/m2    Current exercise per week 90 minutes.

## 2014-11-05 NOTE — Assessment & Plan Note (Signed)
Patient educated about the importance of limiting  Carbohydrate intake , the need to commit to daily physical activity for a minimum of 30 minutes , and to commit weight loss. The fact that changes in all these areas will reduce or eliminate all together the development of diabetes is stressed.  Updated lab needed t.   Diabetic Labs Latest Ref Rng 04/30/2014 01/16/2014 11/08/2013 09/26/2013 06/05/2012  HbA1c <5.7 % 6.2(H) 6.1(H) - 6.3(H) 6.2(H)  Chol 0 - 200 mg/dL - 159 - 261(H) -  HDL >39 mg/dL - 49 - 60 -  Calc LDL 0 - 99 mg/dL - 96 - 186(H) -  Triglycerides <150 mg/dL - 70 - 73 -  Creatinine 0.50 - 1.10 mg/dL - 0.66 0.73 1.17(H) 0.46(L)   BP/Weight 09/09/2014 04/30/2014 01/16/2014 11/16/2013 11/15/2013 11/15/2013 0/35/5974  Systolic BP 163 845 364 680 - 321 -  Diastolic BP 90 88 72 79 - 72 -  Wt. (Lbs) 239 241 246 - 257 - 253  BMI 39.77 40.1 40.94 - 41.5 - 40.85   No flowsheet data found.

## 2014-11-06 NOTE — Telephone Encounter (Signed)
Called and left message for patient to return call.  Will also mail letter

## 2015-01-09 ENCOUNTER — Other Ambulatory Visit: Payer: Self-pay

## 2015-01-09 ENCOUNTER — Telehealth: Payer: Self-pay | Admitting: *Deleted

## 2015-01-09 MED ORDER — LEVOTHYROXINE SODIUM 150 MCG PO TABS: ORAL_TABLET | ORAL | Status: DC

## 2015-01-09 NOTE — Telephone Encounter (Signed)
Med refilled.

## 2015-01-09 NOTE — Telephone Encounter (Signed)
Pt had to reschedule her appt from 01/14/15 to 02/03/15 at 2:45 pt is requesting enough refills on her synthroid until 02/03/15 appt.

## 2015-01-14 ENCOUNTER — Ambulatory Visit: Payer: 59 | Admitting: Family Medicine

## 2015-02-03 ENCOUNTER — Ambulatory Visit: Payer: Self-pay | Admitting: Family Medicine

## 2015-02-25 ENCOUNTER — Ambulatory Visit: Payer: Self-pay | Admitting: Family Medicine

## 2015-02-25 ENCOUNTER — Encounter: Payer: Self-pay | Admitting: *Deleted

## 2015-05-01 ENCOUNTER — Telehealth: Payer: Self-pay | Admitting: Family Medicine

## 2015-05-01 NOTE — Telephone Encounter (Signed)
PATIENT IS ASKING FOR A REFILL ON THYROID MEDICATION levothyroxine (SYNTHROID, LEVOTHROID) 150 MCG tablet PLEASE ADVISE?

## 2015-05-01 NOTE — Telephone Encounter (Signed)
Patient states that she had her Thyroid drawn on Friday Oct 28th at patient states that is was 23 she had skipped some doses due to being out of medication

## 2015-05-02 ENCOUNTER — Other Ambulatory Visit: Payer: Self-pay

## 2015-05-02 MED ORDER — LEVOTHYROXINE SODIUM 150 MCG PO TABS: ORAL_TABLET | ORAL | Status: DC

## 2015-05-02 NOTE — Telephone Encounter (Signed)
Called and left message for patient.   Medication sent to pharmacy

## 2015-05-02 NOTE — Telephone Encounter (Signed)
Received labs on patient.  Will leave for review before medication is sent.

## 2015-05-02 NOTE — Telephone Encounter (Signed)
Called and requested a callback from patient.  Will need to see lab results.

## 2015-05-02 NOTE — Telephone Encounter (Addendum)
BASED ON NON COMPLIANCE WITH MEDICATION , UNABLE TO ACCURATELY DECIDE DOSING. pLS SEND CURRENT DOSE , 1 MONTH SUPPLY REFILL 1 ONLY PT NEEDS TO CLEARLY UNDERSTAND AND ACCEPT RESPONSINBILITY TO HAVE REPT TSH WEEK OF DEC 5 SO THAT SHE WILL BE TREATED APPROPRIATELY, SHE WILL NED ADDITIONAL LABS AT THAT TIME ALSO , CBC, FASTING LIPID, HBA1C, TSH AND HIV AND CHEM 7,PLS ORDER THESE ALSO, ASK HER ABUT HIV TEST BEFORE ORDERING HOWEVER, THANKS

## 2015-05-04 ENCOUNTER — Telehealth: Payer: Self-pay | Admitting: Family Medicine

## 2015-05-04 NOTE — Telephone Encounter (Signed)
Pls let pt know she needs to take iron 325 mg one twice daily, based on recent lab Needs CBC, iron and ferritin added to her upcoming rept lab for the TSH also pls

## 2015-05-05 NOTE — Telephone Encounter (Signed)
Lab order mailed to patient.  Unable to speak with patient or leave a voicemail.  Will make another attempt

## 2015-05-08 NOTE — Telephone Encounter (Signed)
Unable to reach patient by phone 

## 2015-05-08 NOTE — Telephone Encounter (Signed)
Unable to reach patient.   Phone # not working.

## 2015-05-28 ENCOUNTER — Ambulatory Visit: Payer: Self-pay | Admitting: Family Medicine

## 2015-05-28 ENCOUNTER — Encounter: Payer: Self-pay | Admitting: Family Medicine

## 2015-06-02 ENCOUNTER — Other Ambulatory Visit: Payer: Self-pay

## 2015-06-02 MED ORDER — LEVOTHYROXINE SODIUM 150 MCG PO TABS: ORAL_TABLET | ORAL | Status: DC

## 2015-07-08 ENCOUNTER — Telehealth: Payer: Self-pay | Admitting: Family Medicine

## 2015-07-08 NOTE — Telephone Encounter (Signed)
Please call patient she needs a refill on her levothyroxine (SYNTHROID, LEVOTHROID) 150 MCG tablet  And also has questions?

## 2015-07-09 NOTE — Telephone Encounter (Signed)
Patient states that she is completely out

## 2015-07-11 ENCOUNTER — Other Ambulatory Visit: Payer: Self-pay

## 2015-07-11 MED ORDER — LEVOTHYROXINE SODIUM 150 MCG PO TABS: ORAL_TABLET | ORAL | Status: DC

## 2015-07-11 NOTE — Telephone Encounter (Signed)
Called and left another message to call and sch appt and that 1 refill only was sent in

## 2015-10-20 ENCOUNTER — Other Ambulatory Visit: Payer: Self-pay

## 2015-10-20 ENCOUNTER — Telehealth: Payer: Self-pay

## 2015-10-20 DIAGNOSIS — E890 Postprocedural hypothyroidism: Secondary | ICD-10-CM

## 2015-10-20 DIAGNOSIS — R7302 Impaired glucose tolerance (oral): Secondary | ICD-10-CM

## 2015-10-20 DIAGNOSIS — D509 Iron deficiency anemia, unspecified: Secondary | ICD-10-CM

## 2015-10-20 DIAGNOSIS — E785 Hyperlipidemia, unspecified: Secondary | ICD-10-CM

## 2015-10-20 MED ORDER — LEVOTHYROXINE SODIUM 150 MCG PO TABS: ORAL_TABLET | ORAL | Status: DC

## 2015-10-20 NOTE — Telephone Encounter (Signed)
labs ordered

## 2015-11-03 ENCOUNTER — Other Ambulatory Visit: Payer: Self-pay | Admitting: Family Medicine

## 2015-11-03 DIAGNOSIS — D509 Iron deficiency anemia, unspecified: Secondary | ICD-10-CM

## 2015-11-04 LAB — LIPID PANEL
CHOLESTEROL: 170 mg/dL (ref 125–200)
HDL: 53 mg/dL (ref >=46)
LDL Cholesterol: 106 mg/dL (ref <130)
Total CHOL/HDL Ratio: 3.2 Ratio (ref <=5.0)
Triglycerides: 54 mg/dL (ref <150)
VLDL: 11 mg/dL (ref <30)

## 2015-11-04 LAB — CBC WITH DIFFERENTIAL/PLATELET
BASOS ABS: 41 {cells}/uL (ref 0–200)
Basophils Relative: 1 %
EOS PCT: 2 %
Eosinophils Absolute: 82 cells/uL (ref 15–500)
HEMATOCRIT: 33.7 % — AB (ref 35.0–45.0)
Hemoglobin: 10.1 g/dL — ABNORMAL LOW (ref 11.7–15.5)
LYMPHS PCT: 40 %
Lymphs Abs: 1640 cells/uL (ref 850–3900)
MCH: 23.9 pg — AB (ref 27.0–33.0)
MCHC: 30 g/dL — AB (ref 32.0–36.0)
MCV: 79.7 fL — ABNORMAL LOW (ref 80.0–100.0)
MPV: 9.8 fL (ref 7.5–12.5)
Monocytes Absolute: 369 cells/uL (ref 200–950)
Monocytes Relative: 9 %
NEUTROS PCT: 48 %
Neutro Abs: 1968 cells/uL (ref 1500–7800)
Platelets: 301 10*3/uL (ref 140–400)
RBC: 4.23 MIL/uL (ref 3.80–5.10)
RDW: 16.8 % — AB (ref 11.0–15.0)
WBC: 4.1 10*3/uL (ref 3.8–10.8)

## 2015-11-04 LAB — HEMOGLOBIN A1C
Hgb A1c MFr Bld: 5.8 % — ABNORMAL HIGH (ref <5.7)
MEAN PLASMA GLUCOSE: 120 mg/dL

## 2015-11-04 LAB — TSH: TSH: 12.94 m[IU]/L — AB

## 2015-11-05 ENCOUNTER — Encounter: Payer: Self-pay | Admitting: Family Medicine

## 2015-11-05 ENCOUNTER — Ambulatory Visit (INDEPENDENT_AMBULATORY_CARE_PROVIDER_SITE_OTHER): Payer: Self-pay | Admitting: Family Medicine

## 2015-11-05 VITALS — BP 124/82 | HR 85 | Resp 16 | Ht 65.0 in | Wt 248.0 lb

## 2015-11-05 DIAGNOSIS — E785 Hyperlipidemia, unspecified: Secondary | ICD-10-CM

## 2015-11-05 DIAGNOSIS — Z01419 Encounter for gynecological examination (general) (routine) without abnormal findings: Secondary | ICD-10-CM

## 2015-11-05 DIAGNOSIS — E890 Postprocedural hypothyroidism: Secondary | ICD-10-CM

## 2015-11-05 DIAGNOSIS — N920 Excessive and frequent menstruation with regular cycle: Secondary | ICD-10-CM

## 2015-11-05 DIAGNOSIS — R7302 Impaired glucose tolerance (oral): Secondary | ICD-10-CM

## 2015-11-05 DIAGNOSIS — Z0289 Encounter for other administrative examinations: Secondary | ICD-10-CM

## 2015-11-05 DIAGNOSIS — D509 Iron deficiency anemia, unspecified: Secondary | ICD-10-CM

## 2015-11-05 LAB — FERRITIN: FERRITIN: 10 ng/mL (ref 10–154)

## 2015-11-05 LAB — IRON: Iron: 13 ug/dL — ABNORMAL LOW (ref 40–190)

## 2015-11-05 LAB — HIV ANTIBODY (ROUTINE TESTING W REFLEX): HIV: NONREACTIVE

## 2015-11-05 NOTE — Patient Instructions (Signed)
F/u in 6 month, call if you need me before   You need to discuss treatment for heavy periods with Dr Glo Herring when you go for pap, I am referring you  Need to change eating and commit to daily dancing for weight loss  Take iron one twice daily  TSH in 8 weeks  HBa1C CBC, iron and ferritin and TSH in 5.5 months

## 2015-11-05 NOTE — Progress Notes (Signed)
Subjective:    Patient ID: Doris Romero, female    DOB: 05-30-1976, 40 y.o.   MRN: [id number]  HPI   Doris Romero     MRN: [id number]      DOB: 1976-02-09   HPI Ms. Kleinman is here for follow up and re-evaluation of chronic medical conditions, medication management and review of any available recent lab and radiology data.  Preventive health is updated, specifically  Cancer screening and Immunization.   . The PT denies any adverse reactions to current medications since the last visit.  Requests general exam for employment purposes   ROS Denies recent fever or chills. Denies sinus pressure, nasal congestion, ear pain or sore throat. Denies chest congestion, productive cough or wheezing. Denies chest pains, palpitations and leg swelling Denies abdominal pain, nausea, vomiting,diarrhea or constipation.   Denies dysuria, frequency, hesitancy or incontinence. Denies joint pain, swelling and limitation in mobility. Denies headaches, seizures, numbness, or tingling. Denies depression, anxiety or insomnia. Denies skin break down or rash.   PE  BP 124/82 mmHg  Pulse 85  Resp 16  Ht 5\' 5"  (1.651 m)  Wt 248 lb (112.492 kg)  BMI 41.27 kg/m2  SpO2 100%  Patient alert and oriented and in no cardiopulmonary distress.  HEENT: No facial asymmetry, EOMI,   oropharynx pink and moist.  Neck supple no JVD, no mass.  Chest: Clear to auscultation bilaterally.  CVS: S1, S2 no murmurs, no S3.Regular rate.  ABD: Soft non tender.   Ext: No edema  MS: Adequate ROM spine, shoulders, hips and knees.  Skin: Intact, no ulcerations or rash noted.  Psych: Good eye contact, normal affect. Memory intact not anxious or depressed appearing.  CNS: CN 2-12 intact, power,  normal throughout.no focal deficits noted.   Assessment & Plan   Heavy menses Heavy menses, IDA and pt has pap due in [place], refer to gyne for management of her heavy bleeding at time of her pap  Iron deficiency  anemia Start twice daily iron, discuss definitive management of heavy cycles at next gyne exam  Morbid obesity Improved. Pt applauded on succesful weight loss through lifestyle change, and encouraged to continue same. Weight loss goal set for the next several months.   IGT (impaired glucose tolerance) Patient educated about the importance of limiting  Carbohydrate intake , the need to commit to daily physical activity for a minimum of 30 minutes , and to commit weight loss. The fact that changes in all these areas will reduce or eliminate all together the development of diabetes is stressed.  Improved  Diabetic Labs Latest Ref Rng 11/03/2015 04/30/2014 01/16/2014 11/08/2013 09/26/2013  HbA1c <5.7 % 5.8(H) 6.2(H) 6.1(H) - 6.3(H)  Chol 125 - 200 mg/dL 170 - 159 - 261(H)  HDL >=46 mg/dL 53 - 49 - 60  Calc LDL <130 mg/dL 106 - 96 - 186(H)  Triglycerides <150 mg/dL 54 - 70 - 73  Creatinine 0.50 - 1.10 mg/dL - - 0.66 0.73 1.17(H)   BP/Weight 11/05/2015 09/09/2014 04/30/2014 01/16/2014 11/16/2013 11/15/2013 0000000  Systolic BP A999333 123XX123 [id number]  Diastolic BP 82 90 88 72 79 - 72  Wt. (Lbs) 248 239 241 246 - 257 -  BMI 41.27 39.77 40.1 40.94 - 41.5 -   No flowsheet data found.     Hypothyroidism, postradioiodine therapy Under corrected, ptr reports taking medication att various times and incorrectly Reviewed the correct way to take the drug with her, no med change, rept  in 8 weeks  Physical examination of employee Annual exam as documented. Counseling done  re healthy lifestyle involving commitment to 150 minutes exercise per week, heart healthy diet, and attaining healthy weight.The importance of adequate sleep also discussed. Regular seat belt use and home safety, is also discussed. Changes in health habits are decided on by the patient with goals and time frames  set for achieving them. Immunization and cancer screening needs are specifically addressed at this  visit.        Review of Systems     Objective:   Physical Exam        Assessment & Plan:

## 2015-11-08 DIAGNOSIS — N920 Excessive and frequent menstruation with regular cycle: Secondary | ICD-10-CM | POA: Insufficient documentation

## 2015-11-08 DIAGNOSIS — Z0289 Encounter for other administrative examinations: Secondary | ICD-10-CM | POA: Insufficient documentation

## 2015-11-08 NOTE — Assessment & Plan Note (Signed)
Patient educated about the importance of limiting  Carbohydrate intake , the need to commit to daily physical activity for a minimum of 30 minutes , and to commit weight loss. The fact that changes in all these areas will reduce or eliminate all together the development of diabetes is stressed.  Improved  Diabetic Labs Latest Ref Rng 11/03/2015 04/30/2014 01/16/2014 11/08/2013 09/26/2013  HbA1c <5.7 % 5.8(H) 6.2(H) 6.1(H) - 6.3(H)  Chol 125 - 200 mg/dL 170 - 159 - 261(H)  HDL >=46 mg/dL 53 - 49 - 60  Calc LDL <130 mg/dL 106 - 96 - 186(H)  Triglycerides <150 mg/dL 54 - 70 - 73  Creatinine 0.50 - 1.10 mg/dL - - 0.66 0.73 1.17(H)   BP/Weight 11/05/2015 09/09/2014 04/30/2014 01/16/2014 11/16/2013 11/15/2013 0000000  Systolic BP A999333 [id number] - [id number]  Diastolic BP 82 90 88 72 79 - 72  Wt. (Lbs) 248 239 241 246 - 257 -  BMI 41.27 39.77 40.1 40.94 - 41.5 -   No flowsheet data found.

## 2015-11-08 NOTE — Assessment & Plan Note (Signed)
Under corrected, ptr reports taking medication att various times and incorrectly Reviewed the correct way to take the drug with her, no med change, rept in 8 weeks

## 2015-11-08 NOTE — Assessment & Plan Note (Signed)
Improved. Pt applauded on succesful weight loss through lifestyle change, and encouraged to continue same. Weight loss goal set for the next several months.  

## 2015-11-08 NOTE — Assessment & Plan Note (Signed)
Heavy menses, IDA and pt has pap due in [place], refer to gyne for management of her heavy bleeding at time of her pap

## 2015-11-08 NOTE — Assessment & Plan Note (Signed)
Start twice daily iron, discuss definitive management of heavy cycles at next gyne exam

## 2015-11-08 NOTE — Assessment & Plan Note (Signed)

## 2015-11-14 ENCOUNTER — Telehealth: Payer: Self-pay

## 2015-11-14 DIAGNOSIS — D509 Iron deficiency anemia, unspecified: Secondary | ICD-10-CM

## 2015-11-14 NOTE — Telephone Encounter (Signed)
-----   Message from Fayrene Helper, MD sent at 11/08/2015 11:35 AM EDT ----- pls explain iron remains very low, needs to take iron 325 mg one twice daily (OTC) Also get gyne help for heavy menses as we discussed at OV  Will need rept CBc iron and ferritin  In 4 to 6 months also pls

## 2015-11-18 ENCOUNTER — Telehealth: Payer: Self-pay | Admitting: Family Medicine

## 2015-11-18 MED ORDER — LEVOTHYROXINE SODIUM 150 MCG PO TABS: ORAL_TABLET | ORAL | Status: DC

## 2015-11-18 NOTE — Telephone Encounter (Signed)
Patient is asking for a refill on   levothyroxine (SYNTHROID, LEVOTHROID) 150 MCG tablet  And Diet Pills Adipex Generic

## 2015-11-18 NOTE — Telephone Encounter (Signed)
Patient's med has been refilled

## 2015-12-02 ENCOUNTER — Ambulatory Visit (INDEPENDENT_AMBULATORY_CARE_PROVIDER_SITE_OTHER): Payer: No Typology Code available for payment source | Admitting: Obstetrics and Gynecology

## 2015-12-02 ENCOUNTER — Encounter: Payer: Self-pay | Admitting: Obstetrics and Gynecology

## 2015-12-02 VITALS — BP 120/70 | HR 80 | Ht 66.0 in | Wt 243.0 lb

## 2015-12-02 DIAGNOSIS — N920 Excessive and frequent menstruation with regular cycle: Secondary | ICD-10-CM

## 2015-12-02 DIAGNOSIS — D509 Iron deficiency anemia, unspecified: Secondary | ICD-10-CM

## 2015-12-02 DIAGNOSIS — E039 Hypothyroidism, unspecified: Secondary | ICD-10-CM | POA: Diagnosis not present

## 2015-12-02 DIAGNOSIS — N924 Excessive bleeding in the premenopausal period: Secondary | ICD-10-CM | POA: Insufficient documentation
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
## 2015-12-08 ENCOUNTER — Other Ambulatory Visit: Payer: Self-pay | Admitting: Obstetrics and Gynecology

## 2015-12-08 DIAGNOSIS — D509 Iron deficiency anemia, unspecified: Secondary | ICD-10-CM

## 2015-12-09 ENCOUNTER — Ambulatory Visit (INDEPENDENT_AMBULATORY_CARE_PROVIDER_SITE_OTHER): Payer: No Typology Code available for payment source

## 2015-12-09 DIAGNOSIS — N854 Malposition of uterus: Secondary | ICD-10-CM

## 2015-12-09 DIAGNOSIS — D250 Submucous leiomyoma of uterus: Secondary | ICD-10-CM

## 2015-12-09 DIAGNOSIS — D509 Iron deficiency anemia, unspecified: Secondary | ICD-10-CM

## 2015-12-09 DIAGNOSIS — D252 Subserosal leiomyoma of uterus: Secondary | ICD-10-CM

## 2015-12-09 DIAGNOSIS — N921 Excessive and frequent menstruation with irregular cycle: Secondary | ICD-10-CM

## 2015-12-09 NOTE — Progress Notes (Signed)
PELVIC US TA/TV: Heterogenous anteverted uterus w/mult fibroids,left subserosal fibroid 7.4 x 5.7 x 6.2 cm,(#2) ant submucosal fibroid 3.3 x 3 x 2.4cm,EEC 16 mm (distorted by fibroid),normal ov's bilat(mobile),no free fluid seen

## 2015-12-16 ENCOUNTER — Other Ambulatory Visit: Payer: No Typology Code available for payment source | Admitting: Obstetrics and Gynecology

## 2016-03-29 ENCOUNTER — Other Ambulatory Visit: Payer: Self-pay

## 2016-03-29 MED ORDER — LEVOTHYROXINE SODIUM 150 MCG PO TABS: ORAL_TABLET | ORAL | 3 refills | Status: DC

## 2016-03-29 MED ORDER — LEVOTHYROXINE SODIUM 150 MCG PO TABS: ORAL_TABLET | ORAL | 4 refills | Status: DC

## 2016-04-28 ENCOUNTER — Telehealth: Payer: Self-pay | Admitting: Family Medicine

## 2016-04-28 LAB — CBC
HEMATOCRIT: 31.8 % — AB (ref 35.0–45.0)
Hemoglobin: 9.3 g/dL — ABNORMAL LOW (ref 11.7–15.5)
MCH: 21.8 pg — ABNORMAL LOW (ref 27.0–33.0)
MCHC: 29.2 g/dL — AB (ref 32.0–36.0)
MCV: 74.6 fL — AB (ref 80.0–100.0)
MPV: 9.5 fL (ref 7.5–12.5)
Platelets: 326 10*3/uL (ref 140–400)
RBC: 4.26 MIL/uL (ref 3.80–5.10)
RDW: 16.6 % — AB (ref 11.0–15.0)
WBC: 4.7 10*3/uL (ref 3.8–10.8)

## 2016-04-28 MED ORDER — LEVOTHYROXINE SODIUM 150 MCG PO TABS: ORAL_TABLET | ORAL | 3 refills | Status: DC

## 2016-04-28 NOTE — Telephone Encounter (Signed)
Med refilled.

## 2016-04-28 NOTE — Telephone Encounter (Signed)
Doris Romero is stating she had labs drawn this morning, she only has one levothyroxine (SYNTHROID, LEVOTHROID) 150 MCG tablet left , please advise?

## 2016-04-29 LAB — TSH: TSH: 5.42 mIU/L — ABNORMAL HIGH

## 2016-04-29 LAB — FERRITIN: FERRITIN: 5 ng/mL — AB (ref 10–232)

## 2016-04-29 LAB — HEMOGLOBIN A1C
Hgb A1c MFr Bld: 5.7 % — ABNORMAL HIGH (ref <5.7)
Mean Plasma Glucose: 117 mg/dL

## 2016-04-29 LAB — IRON: Iron: 11 ug/dL — ABNORMAL LOW (ref 40–190)

## 2016-05-04 ENCOUNTER — Ambulatory Visit (INDEPENDENT_AMBULATORY_CARE_PROVIDER_SITE_OTHER): Payer: Self-pay | Admitting: Family Medicine

## 2016-05-04 ENCOUNTER — Encounter: Payer: Self-pay | Admitting: Family Medicine

## 2016-05-04 VITALS — BP 134/82 | HR 90 | Resp 14 | Ht 66.0 in | Wt 246.0 lb

## 2016-05-04 DIAGNOSIS — E785 Hyperlipidemia, unspecified: Secondary | ICD-10-CM

## 2016-05-04 DIAGNOSIS — E8881 Metabolic syndrome: Secondary | ICD-10-CM

## 2016-05-04 DIAGNOSIS — E890 Postprocedural hypothyroidism: Secondary | ICD-10-CM

## 2016-05-04 DIAGNOSIS — D508 Other iron deficiency anemias: Secondary | ICD-10-CM

## 2016-05-04 DIAGNOSIS — R7302 Impaired glucose tolerance (oral): Secondary | ICD-10-CM

## 2016-05-04 MED ORDER — LEVOTHYROXINE SODIUM 150 MCG PO TABS: ORAL_TABLET | ORAL | 1 refills | Status: DC

## 2016-05-04 NOTE — Patient Instructions (Signed)
F/u in 5 month, call ifm you need me before  No change in dose of synthroid, rept TSh in 2.5 month  It is important that you exercise regularly at least 30 minutes 5 times a week. If you develop chest pain, have severe difficulty breathing, or feel very tired, stop exercising immediately and seek medical attention    NEED to see hematology re low iron and anemia, In the interim, PLEASE commit to daily iron supplement,  You are anemic, this makes you weak  NEED mammogram and Pap, PLEASE follow through  Congrats on improved bloodfd sugar  Thank you  for choosing  [facility]. We consider it a privelige to serve you.  Delivering excellent health care in a caring and  compassionate way is our goal.  Partnering with you,  so that together we can achieve this goal is our strategy.   Weight loss goal of 10 pounds

## 2016-05-04 NOTE — Assessment & Plan Note (Signed)
Unchanged, refuses heme consult for iV iron and intolerant of oral, will attempt to increase oral iron intake

## 2016-05-04 NOTE — Assessment & Plan Note (Signed)
Hyperlipidemia:Low fat diet discussed and encouraged.   Lipid Panel  Lab Results  Component Value Date   CHOL 170 11/03/2015   HDL 53 11/03/2015   LDLCALC 106 11/03/2015   TRIG 54 11/03/2015   CHOLHDL 3.2 11/03/2015   Updated lab needed at/ before next visit.;

## 2016-05-04 NOTE — Assessment & Plan Note (Signed)
The increased risk of cardiovascular disease associated with this diagnosis, and the need to consistently work on lifestyle to change this is discussed. Following  a  heart healthy diet ,commitment to 30 minutes of exercise at least 5 days per week, as well as control of blood sugar and cholesterol , and achieving a healthy weight are all the areas to be addressed .  

## 2016-05-04 NOTE — Assessment & Plan Note (Signed)
Improved Patient educated about the importance of limiting  Carbohydrate intake , the need to commit to daily physical activity for a minimum of 30 minutes , and to commit weight loss. The fact that changes in all these areas will reduce or eliminate all together the development of diabetes is stressed.   Diabetic Labs Latest Ref Rng & Units 04/28/2016 11/03/2015 04/30/2014 01/16/2014 11/08/2013  HbA1c <5.7 % 5.7(H) 5.8(H) 6.2(H) 6.1(H) -  Chol 125 - 200 mg/dL - 170 - 159 -  HDL >=46 mg/dL - 53 - 49 -  Calc LDL <130 mg/dL - 106 - 96 -  Triglycerides <150 mg/dL - 54 - 70 -  Creatinine 0.50 - 1.10 mg/dL - - - 0.66 0.73   BP/Weight 05/04/2016 12/02/2015 11/05/2015 09/09/2014 04/30/2014 01/16/2014 99991111  Systolic BP Q000111Q 123456 A999333 123XX123 [id number]  Diastolic BP 82 70 82 90 88 72 79  Wt. (Lbs) 246 243 248 239 241 246 -  BMI 39.71 39.24 41.27 39.77 40.1 40.94 -   No flowsheet data found.

## 2016-05-04 NOTE — Progress Notes (Signed)
Doris Romero     MRN: [id number]      DOB: July 22, 1975   HPI Ms. Stair is here for follow up and re-evaluation of chronic medical conditions, medication management and review of any available recent lab and radiology data.  Preventive health is updated, specifically  Cancer screening and Immunization.   Questions or concerns regarding consultations or procedures which the PT has had in the interim are  Addressed.still needs pap, has seen gyne , has fibroids which are contributing to her IDA Takes ioron sp[oradically states mkes her constipated, refuses hematology and IV iron, states can function with h B of 7 There are no new concerns. Thinks losing inches, no regular exercise There are no specific complaints   ROS Denies recent fever or chills. Denies sinus pressure, nasal congestion, ear pain or sore throat. Denies chest congestion, productive cough or wheezing. Denies chest pains, palpitations and leg swelling Denies abdominal pain, nausea, vomiting,diarrhea or constipation.   Denies dysuria, frequency, hesitancy or incontinence. Denies joint pain, swelling and limitation in mobility. Denies headaches, seizures, numbness, or tingling. Denies depression, anxiety or insomnia. Denies skin break down or rash.   PE  BP 134/82   Pulse 90   Ht 5\' 6"  (1.676 m)   Wt 246 lb (111.6 kg)   SpO2 100%   BMI 39.71 kg/m   Patient alert and oriented and in no cardiopulmonary distress.  HEENT: No facial asymmetry, EOMI,   oropharynx pink and moist.  Neck supple no JVD, no mass.  Chest: Clear to auscultation bilaterally.  CVS: S1, S2 no murmurs, no S3.Regular rate.  ABD: Soft non tender.   Ext: No edema  MS: Adequate ROM spine, shoulders, hips and knees.  Skin: Intact, no ulcerations or rash noted.  Psych: Good eye contact, normal affect. Memory intact not anxious or depressed appearing.  CNS: CN 2-12 intact, power,  normal throughout.no focal deficits noted.   Assessment &  Plan  Iron deficiency anemia Unchanged, refuses heme consult for iV iron and intolerant of oral, will attempt to increase oral iron intake  Hypothyroidism, postradioiodine therapy Apparently mildly under corrected, ingestion reviewed, states doing this, will rept in 6 to 8 weeks, no me change  Dyslipidemia Hyperlipidemia:Low fat diet discussed and encouraged.   Lipid Panel  Lab Results  Component Value Date   CHOL 170 11/03/2015   HDL 53 11/03/2015   LDLCALC 106 11/03/2015   TRIG 54 11/03/2015   CHOLHDL 3.2 11/03/2015   Updated lab needed at/ before next visit.;     IGT (impaired glucose tolerance) Improved Patient educated about the importance of limiting  Carbohydrate intake , the need to commit to daily physical activity for a minimum of 30 minutes , and to commit weight loss. The fact that changes in all these areas will reduce or eliminate all together the development of diabetes is stressed.   Diabetic Labs Latest Ref Rng & Units 04/28/2016 11/03/2015 04/30/2014 01/16/2014 11/08/2013  HbA1c <5.7 % 5.7(H) 5.8(H) 6.2(H) 6.1(H) -  Chol 125 - 200 mg/dL - 170 - 159 -  HDL >=46 mg/dL - 53 - 49 -  Calc LDL <130 mg/dL - 106 - 96 -  Triglycerides <150 mg/dL - 54 - 70 -  Creatinine 0.50 - 1.10 mg/dL - - - 0.66 0.73   BP/Weight 05/04/2016 12/02/2015 11/05/2015 09/09/2014 04/30/2014 01/16/2014 99991111  Systolic BP Q000111Q 123456 A999333 [id number]  Diastolic BP 82 70 82 90 88 72 79  Wt. (Lbs) 246  243 248 239 241 246 -  BMI 39.71 39.24 41.27 39.77 40.1 40.94 -   No flowsheet data found.    Morbid obesity Deteriorated. Patient re-educated about  the importance of commitment to a  minimum of 150 minutes of exercise per week.  The importance of healthy food choices with portion control discussed. Encouraged to start a food diary, count calories and to consider  joining a support group. Sample diet sheets offered. Goals set by the patient for the next several months.   Weight /BMI  05/04/2016 12/02/2015 11/05/2015  WEIGHT 246 lb 243 lb 248 lb  HEIGHT 5\' 6"  5\' 6"  5\' 5"   BMI 39.71 kg/m2 39.24 kg/m2 41.27 kg/m2  Discontinue phentermine and focus on lifestyle change    Metabolic syndrome X The increased risk of cardiovascular disease associated with this diagnosis, and the need to consistently work on lifestyle to change this is discussed. Following  a  heart healthy diet ,commitment to 30 minutes of exercise at least 5 days per week, as well as control of blood sugar and cholesterol , and achieving a healthy weight are all the areas to be addressed .

## 2016-05-04 NOTE — Assessment & Plan Note (Signed)
Apparently mildly under corrected, ingestion reviewed, states doing this, will rept in 6 to 8 weeks, no me change

## 2016-05-04 NOTE — Assessment & Plan Note (Signed)
Deteriorated. Patient re-educated about  the importance of commitment to a  minimum of 150 minutes of exercise per week.  The importance of healthy food choices with portion control discussed. Encouraged to start a food diary, count calories and to consider  joining a support group. Sample diet sheets offered. Goals set by the patient for the next several months.   Weight /BMI 05/04/2016 12/02/2015 11/05/2015  WEIGHT 246 lb 243 lb 248 lb  HEIGHT 5\' 6"  5\' 6"  5\' 5"   BMI 39.71 kg/m2 39.24 kg/m2 41.27 kg/m2  Discontinue phentermine and focus on lifestyle change

## 2016-10-04 ENCOUNTER — Ambulatory Visit: Payer: Self-pay | Admitting: Family Medicine

## 2016-10-19 ENCOUNTER — Telehealth: Payer: Self-pay | Admitting: Family Medicine

## 2016-10-19 NOTE — Telephone Encounter (Signed)
Patient is requesting a copy of Immunizations for a job,  I dont see anything, can you find anything ?  Please call [phone]

## 2016-10-19 NOTE — Telephone Encounter (Signed)
I don't see any vaccines listed on her immunization summary

## 2016-11-15 ENCOUNTER — Ambulatory Visit: Payer: Self-pay | Admitting: Family Medicine

## 2016-11-26 ENCOUNTER — Other Ambulatory Visit: Payer: Self-pay

## 2016-11-26 ENCOUNTER — Telehealth: Payer: Self-pay | Admitting: Family Medicine

## 2016-11-26 MED ORDER — LEVOTHYROXINE SODIUM 150 MCG PO TABS: ORAL_TABLET | ORAL | 0 refills | Status: DC

## 2016-11-26 NOTE — Telephone Encounter (Signed)
Patient called in requesting a refill for synthroid, only 2 left until next office visit, brandi sent refill to pharmacy.

## 2017-07-11 ENCOUNTER — Other Ambulatory Visit: Payer: Self-pay | Admitting: Family Medicine

## 2017-08-09 ENCOUNTER — Other Ambulatory Visit: Payer: Self-pay | Admitting: Family Medicine

## 2017-09-02 ENCOUNTER — Other Ambulatory Visit: Payer: Self-pay | Admitting: Family Medicine

## 2017-09-02 ENCOUNTER — Telehealth: Payer: Self-pay | Admitting: Family Medicine

## 2017-09-02 DIAGNOSIS — R7302 Impaired glucose tolerance (oral): Secondary | ICD-10-CM

## 2017-09-02 DIAGNOSIS — E559 Vitamin D deficiency, unspecified: Secondary | ICD-10-CM

## 2017-09-02 DIAGNOSIS — E890 Postprocedural hypothyroidism: Secondary | ICD-10-CM

## 2017-09-02 DIAGNOSIS — D508 Other iron deficiency anemias: Secondary | ICD-10-CM

## 2017-09-02 DIAGNOSIS — E8881 Metabolic syndrome: Secondary | ICD-10-CM

## 2017-09-02 DIAGNOSIS — E785 Hyperlipidemia, unspecified: Secondary | ICD-10-CM

## 2017-09-02 NOTE — Telephone Encounter (Signed)
Pls order fasting CBC, iron and ferritin, lipid, cmp and EGFR, tSH, HBA1C, vit D

## 2017-09-02 NOTE — Telephone Encounter (Signed)
Cb# [phone]  Needs new lab order for appointment on 09/14/17

## 2017-09-02 NOTE — Telephone Encounter (Signed)
Hasn't been in 2 years. What labs does she need?

## 2017-09-05 NOTE — Telephone Encounter (Signed)
Labs ordered.

## 2017-09-13 ENCOUNTER — Telehealth: Payer: Self-pay

## 2017-09-13 NOTE — Telephone Encounter (Signed)
Opened in error

## 2017-09-14 ENCOUNTER — Encounter: Payer: Self-pay | Admitting: Family Medicine

## 2017-09-14 ENCOUNTER — Ambulatory Visit (INDEPENDENT_AMBULATORY_CARE_PROVIDER_SITE_OTHER): Payer: Self-pay | Admitting: Family Medicine

## 2017-09-14 VITALS — BP 130/82 | HR 101 | Resp 16 | Ht 66.0 in | Wt 251.0 lb

## 2017-09-14 DIAGNOSIS — R7302 Impaired glucose tolerance (oral): Secondary | ICD-10-CM

## 2017-09-14 DIAGNOSIS — N924 Excessive bleeding in the premenopausal period: Secondary | ICD-10-CM

## 2017-09-14 DIAGNOSIS — E890 Postprocedural hypothyroidism: Secondary | ICD-10-CM

## 2017-09-14 DIAGNOSIS — D500 Iron deficiency anemia secondary to blood loss (chronic): Secondary | ICD-10-CM

## 2017-09-14 LAB — HEMOGLOBIN A1C W/OUT EAG: Hgb A1c MFr Bld: 5.9 % of total Hgb — ABNORMAL HIGH (ref <5.7)

## 2017-09-14 LAB — COMPLETE METABOLIC PANEL WITH GFR
AG Ratio: 1.1 (calc) (ref 1.0–2.5)
ALKALINE PHOSPHATASE (APISO): 45 U/L (ref 33–115)
ALT: 8 U/L (ref 6–29)
AST: 13 U/L (ref 10–30)
Albumin: 4 g/dL (ref 3.6–5.1)
BUN: 19 mg/dL (ref 7–25)
CALCIUM: 9 mg/dL (ref 8.6–10.2)
CO2: 28 mmol/L (ref 20–32)
CREATININE: 0.75 mg/dL (ref 0.50–1.10)
Chloride: 104 mmol/L (ref 98–110)
GFR, EST NON AFRICAN AMERICAN: 99 mL/min/{1.73_m2} (ref >=60)
GFR, Est African American: 115 mL/min/{1.73_m2} (ref >=60)
GLUCOSE: 93 mg/dL (ref 65–99)
Globulin: 3.7 g/dL (calc) (ref 1.9–3.7)
Potassium: 4.2 mmol/L (ref 3.5–5.3)
Sodium: 136 mmol/L (ref 135–146)
Total Bilirubin: 0.2 mg/dL (ref 0.2–1.2)
Total Protein: 7.7 g/dL (ref 6.1–8.1)

## 2017-09-14 LAB — CBC
HEMATOCRIT: 28.8 % — AB (ref 35.0–45.0)
HEMOGLOBIN: 8.4 g/dL — AB (ref 11.7–15.5)
MCH: 19.3 pg — ABNORMAL LOW (ref 27.0–33.0)
MCHC: 29.2 g/dL — ABNORMAL LOW (ref 32.0–36.0)
MCV: 66.1 fL — ABNORMAL LOW (ref 80.0–100.0)
MPV: 10.2 fL (ref 7.5–12.5)
Platelets: 414 10*3/uL — ABNORMAL HIGH (ref 140–400)
RBC: 4.36 10*6/uL (ref 3.80–5.10)
RDW: 16.4 % — ABNORMAL HIGH (ref 11.0–15.0)
WBC: 4.7 10*3/uL (ref 3.8–10.8)

## 2017-09-14 LAB — TSH: TSH: 2.48 m[IU]/L

## 2017-09-14 LAB — IRON: IRON: 12 ug/dL — AB (ref 40–190)

## 2017-09-14 LAB — FERRITIN: FERRITIN: 4 ng/mL — AB (ref 10–232)

## 2017-09-14 MED ORDER — PHENTERMINE HCL 37.5 MG PO TABS: 37.5000 mg | ORAL_TABLET | Freq: Every day | ORAL | 1 refills | Status: DC

## 2017-09-14 NOTE — Patient Instructions (Signed)
F/u in 3 months, and 3 weeks , call if you need   Me sooner  Start slow iron one twice daily  Change food choice  And commit to 64 oz water daily and 3 meals  It is important that you exercise regularly at least 30 minutes 5 times a week. If you develop chest pain, have severe difficulty breathing, or feel very tired, stop exercising immediately and seek medical attention    Half phentermine daily to suppress appetite  Cut back on starchy foods and sugar  And portion size    Thank you  for choosing [facility]. We consider it a privelige to serve you.  Delivering excellent health care in a caring and  compassionate way is our goal.  Partnering with you,  so that together we can achieve this goal is our strategy.

## 2017-09-14 NOTE — Assessment & Plan Note (Signed)
Patient educated about the importance of limiting  Carbohydrate intake , the need to commit to daily physical activity for a minimum of 30 minutes , and to commit weight loss. The fact that changes in all these areas will reduce or eliminate all together the development of diabetes is stressed.  Deteriorated Diabetic Labs Latest Ref Rng & Units 09/13/2017 04/28/2016 11/03/2015 04/30/2014 01/16/2014  HbA1c <5.7 % of total Hgb 5.9(H) 5.7(H) 5.8(H) 6.2(H) 6.1(H)  Chol 125 - 200 mg/dL - - 170 - 159  HDL >=46 mg/dL - - 53 - 49  Calc LDL <130 mg/dL - - 106 - 96  Triglycerides <150 mg/dL - - 54 - 70  Creatinine 0.50 - 1.10 mg/dL 0.75 - - - 0.66   BP/Weight 09/14/2017 05/04/2016 12/02/2015 11/05/2015 09/09/2014 04/30/2014 12/02/3014  Systolic BP 010 932 355 732 202 542 706  Diastolic BP 82 82 70 82 90 88 72  Wt. (Lbs) 251 246 243 248 239 241 246  BMI 40.51 39.71 39.24 41.27 39.77 40.1 40.94   No flowsheet data found.

## 2017-09-14 NOTE — Assessment & Plan Note (Signed)
No consistent supplementation, needs to commit to twice daily iron supplement

## 2017-09-14 NOTE — Assessment & Plan Note (Signed)
Ongoing problem , however , no plan to take action at this time
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
## 2017-09-14 NOTE — Assessment & Plan Note (Signed)
Controlled, no change in medication  

## 2017-10-08 ENCOUNTER — Other Ambulatory Visit: Payer: Self-pay | Admitting: Family Medicine

## 2017-11-16 ENCOUNTER — Other Ambulatory Visit: Payer: Self-pay

## 2017-11-16 ENCOUNTER — Other Ambulatory Visit: Payer: Self-pay | Admitting: Family Medicine

## 2017-11-16 MED ORDER — LEVOTHYROXINE SODIUM 150 MCG PO TABS: 150.0000 ug | ORAL_TABLET | Freq: Every day | ORAL | 5 refills | Status: DC

## 2017-11-16 NOTE — Telephone Encounter (Signed)
PT is calling and needs this RX Today Levothyroxine Sodium

## 2017-11-24 ENCOUNTER — Encounter: Payer: Self-pay | Admitting: Family Medicine

## 2017-11-24 ENCOUNTER — Ambulatory Visit (INDEPENDENT_AMBULATORY_CARE_PROVIDER_SITE_OTHER): Payer: Self-pay | Admitting: Family Medicine

## 2017-11-24 ENCOUNTER — Other Ambulatory Visit: Payer: Self-pay

## 2017-11-24 VITALS — BP 116/74 | HR 107 | Ht 66.0 in | Wt 239.0 lb

## 2017-11-24 DIAGNOSIS — E890 Postprocedural hypothyroidism: Secondary | ICD-10-CM

## 2017-11-24 DIAGNOSIS — D500 Iron deficiency anemia secondary to blood loss (chronic): Secondary | ICD-10-CM

## 2017-11-24 DIAGNOSIS — N920 Excessive and frequent menstruation with regular cycle: Secondary | ICD-10-CM

## 2017-11-24 MED ORDER — PHENTERMINE HCL 37.5 MG PO TABS: 37.5000 mg | ORAL_TABLET | Freq: Every day | ORAL | 2 refills | Status: DC

## 2017-11-24 NOTE — Patient Instructions (Addendum)
F/U in 11 to 12 weeks, call if you need me sooner   Congrats on weight loss, keep it up   Take iron 325 mg one tablet 2 to 3 times every day, you need to save your heart!  Continue  Once daily phenetermine  Iron and Hb  3 days before f/u  It is important that you exercise regularly at least 30 minutes 5 times a week. If you develop chest pain, have severe difficulty breathing, or feel very tired, stop exercising immediately and seek medical attention \

## 2017-11-26 ENCOUNTER — Encounter: Payer: Self-pay | Admitting: Family Medicine

## 2017-11-26 NOTE — Assessment & Plan Note (Signed)
Non compliant, importance of same is stressed

## 2017-11-26 NOTE — Progress Notes (Signed)
   Doris Romero     MRN: [id number]      DOB: 20-Nov-1975   HPI Doris Romero is here for follow up and re-evaluation of chronic medical conditions, medication management and review of any available recent lab and radiology data.  Holding out on pap for financial reasons There are no new concerns.  There are no specific complaints  Chronic fatigue and heavy menses , still non compliant with iron  ROS Denies recent fever or chills. Denies sinus pressure, nasal congestion, ear pain or sore throat. Denies chest congestion, productive cough or wheezing. Denies chest pains, palpitations and leg swelling Denies abdominal pain, nausea, vomiting,diarrhea or constipation.   Denies dysuria, frequency, hesitancy or incontinence. . Denies skin break down or rash.   PE  BP 116/74 (BP Location: Left Arm, Patient Position: Sitting, Cuff Size: Large)   Pulse (!) 107   Ht 5\' 6"  (1.676 m)   Wt 239 lb 0.6 oz (108.4 kg)   SpO2 98%   BMI 38.58 kg/m   Patient alert and oriented and in no cardiopulmonary distress.  HEENT: No facial asymmetry, EOMI,   oropharynx pink and moist.  Neck supple no JVD, no mass.  Chest: Clear to auscultation bilaterally.  CVS: S1, S2 no murmurs, no S3.Regular rate.  ABD: Soft non tender.   Ext: No edema  MS: Adequate ROM spine, shoulders, hips and knees.  Skin: Intact, no ulcerations or rash noted.  Psych: Good eye contact, normal affect. Memory intact not anxious or depressed appearing.  CNS: CN 2-12 intact, power,  normal throughout.no focal deficits noted.   Assessment & Plan  Morbid obesity Improved, she has done very well and is ap lauded on this. Needs to start regular exercise.Continmue phentermine as before Patient re-educated about  the importance of commitment to a  minimum of 150 minutes of exercise per week.  The importance of healthy food choices with portion control discussed. Encouraged to start a food diary, count calories and to consider   joining a support group. Sample diet sheets offered. Goals set by the patient for the next several months.   Weight /BMI 11/24/2017 09/14/2017 05/04/2016  WEIGHT 239 lb 0.6 oz 251 lb 246 lb  HEIGHT 5\' 6"  5\' 6"  5\' 6"   BMI 38.58 kg/m2 40.51 kg/m2 39.71 kg/m2      Iron deficiency anemia Non compliant, importance of same is stressed  Hypothyroidism, postradioiodine therapy Controlled, no change in medication   Heavy menses Unchanged, does not favor ablation, thinking of hysterectomy

## 2017-11-26 NOTE — Assessment & Plan Note (Signed)
Improved, she has done very well and is ap lauded on this. Needs to start regular exercise.Continmue phentermine as before Patient re-educated about  the importance of commitment to a  minimum of 150 minutes of exercise per week.  The importance of healthy food choices with portion control discussed. Encouraged to start a food diary, count calories and to consider  joining a support group. Sample diet sheets offered. Goals set by the patient for the next several months.   Weight /BMI 11/24/2017 09/14/2017 05/04/2016  WEIGHT 239 lb 0.6 oz 251 lb 246 lb  HEIGHT 5\' 6"  5\' 6"  5\' 6"   BMI 38.58 kg/m2 40.51 kg/m2 39.71 kg/m2

## 2017-11-26 NOTE — Assessment & Plan Note (Signed)
Controlled, no change in medication  

## 2017-11-26 NOTE — Assessment & Plan Note (Signed)
Unchanged, does not favor ablation, thinking of hysterectomy

## 2018-02-09 ENCOUNTER — Ambulatory Visit: Payer: Self-pay | Admitting: Family Medicine

## 2018-05-18 ENCOUNTER — Other Ambulatory Visit: Payer: Self-pay | Admitting: Family Medicine

## 2018-09-04 ENCOUNTER — Other Ambulatory Visit: Payer: Self-pay

## 2018-09-04 ENCOUNTER — Telehealth: Payer: Self-pay | Admitting: Family Medicine

## 2018-09-04 MED ORDER — LEVOTHYROXINE SODIUM 150 MCG PO TABS: 150.0000 ug | ORAL_TABLET | Freq: Every day | ORAL | 0 refills | Status: DC

## 2018-09-04 NOTE — Telephone Encounter (Signed)
Please call in levothyroxine (SYNTHROID, LEVOTHROID) 150 MCG tablet To the pharmacy --Pt is out of her medicine

## 2018-09-04 NOTE — Progress Notes (Signed)
Levothyroxine reordered.

## 2018-11-27 ENCOUNTER — Telehealth: Payer: Self-pay | Admitting: Family Medicine

## 2018-11-27 ENCOUNTER — Other Ambulatory Visit: Payer: Self-pay | Admitting: Family Medicine

## 2018-11-27 NOTE — Telephone Encounter (Signed)
Last visit was a year ago, should have followed back up in 12 weeks. Needs appt for refill

## 2018-11-27 NOTE — Telephone Encounter (Signed)
Pt needs refill on Synthroid medication 150MCG

## 2018-11-28 ENCOUNTER — Telehealth: Payer: Self-pay

## 2018-11-28 DIAGNOSIS — E8881 Metabolic syndrome: Secondary | ICD-10-CM

## 2018-11-28 DIAGNOSIS — D500 Iron deficiency anemia secondary to blood loss (chronic): Secondary | ICD-10-CM

## 2018-11-28 DIAGNOSIS — E890 Postprocedural hypothyroidism: Secondary | ICD-10-CM

## 2018-11-28 DIAGNOSIS — E785 Hyperlipidemia, unspecified: Secondary | ICD-10-CM

## 2018-11-28 NOTE — Telephone Encounter (Signed)
Labs ordered to be done before office visit

## 2018-11-28 NOTE — Telephone Encounter (Signed)
LVM with the pt  To call the office

## 2018-11-28 NOTE — Telephone Encounter (Signed)
Could I put the pt on the schedule for a phone visit, or should this be a office visit?

## 2018-11-28 NOTE — Telephone Encounter (Signed)
Ov, and will need labs prior

## 2018-12-20 ENCOUNTER — Ambulatory Visit: Payer: Self-pay | Admitting: Family Medicine

## 2019-02-28 ENCOUNTER — Other Ambulatory Visit: Payer: Self-pay | Admitting: Family Medicine

## 2019-03-07 ENCOUNTER — Other Ambulatory Visit: Payer: Self-pay

## 2019-03-07 MED ORDER — LEVOTHYROXINE SODIUM 150 MCG PO TABS: 150.0000 ug | ORAL_TABLET | Freq: Every day | ORAL | 5 refills | Status: DC

## 2019-06-12 ENCOUNTER — Other Ambulatory Visit: Payer: Self-pay

## 2019-06-12 ENCOUNTER — Telehealth: Payer: Self-pay | Admitting: *Deleted

## 2019-06-12 MED ORDER — LEVOTHYROXINE SODIUM 150 MCG PO TABS: 150.0000 ug | ORAL_TABLET | Freq: Every day | ORAL | 1 refills | Status: DC

## 2019-06-12 NOTE — Telephone Encounter (Signed)
Med sent in.

## 2019-06-12 NOTE — Telephone Encounter (Signed)
Pt is about out of synthroid she is going to be out of medication she did go ahead and sign up to do a phone follow up with dr simpson tomorrow but she needs this called in today as she is going out of town and will not be here after tomorrow. Would like a call back once this is sent in to the pharmacy

## 2019-06-13 ENCOUNTER — Encounter: Payer: Self-pay | Admitting: Family Medicine

## 2019-06-13 ENCOUNTER — Ambulatory Visit (INDEPENDENT_AMBULATORY_CARE_PROVIDER_SITE_OTHER): Payer: Self-pay | Admitting: Family Medicine

## 2019-06-13 ENCOUNTER — Other Ambulatory Visit: Payer: Self-pay

## 2019-06-13 VITALS — BP 127/80 | Ht 65.0 in | Wt 241.0 lb

## 2019-06-13 DIAGNOSIS — R7301 Impaired fasting glucose: Secondary | ICD-10-CM

## 2019-06-13 DIAGNOSIS — D508 Other iron deficiency anemias: Secondary | ICD-10-CM

## 2019-06-13 DIAGNOSIS — E890 Postprocedural hypothyroidism: Secondary | ICD-10-CM

## 2019-06-13 DIAGNOSIS — E8881 Metabolic syndrome: Secondary | ICD-10-CM

## 2019-06-13 DIAGNOSIS — R7302 Impaired glucose tolerance (oral): Secondary | ICD-10-CM

## 2019-06-13 DIAGNOSIS — E785 Hyperlipidemia, unspecified: Secondary | ICD-10-CM

## 2019-06-13 NOTE — Patient Instructions (Addendum)
F/u in office with MD in 6 months, call if you need me before  You NEED labs , you have had none in over 12 months  Fasting lipid, chem 7 and EGGFr, TSH, CBC, iron  And HBA1C at [facility] , the order is being sent in today   It is important that you exercise regularly at least 30 minutes 5 times a week. If you develop chest pain, have severe difficulty breathing, or feel very tired, stop exercising immediately and seek medical attention  Think about what you will eat, plan ahead. Choose " clean, green, fresh or frozen" over canned, processed or packaged foods which are more sugary, salty and fatty. 70 to 75% of food eaten should be vegetables and fruit. Three meals at set times with snacks allowed between meals, but they must be fruit or vegetables. Aim to eat over a 12 hour period , example 7 am to 7 pm, and STOP after  your last meal of the day. Drink water,generally about 64 ounces per day, no other drink is as healthy. Fruit juice is best enjoyed in a healthy way, by EATING the fruit.   Thanks for choosing [facility], we consider it a privelige to serve you.

## 2019-06-13 NOTE — Progress Notes (Signed)
Virtual Visit via Telephone Note  I connected with Doris Romero on 06/17/19 at 10:20 AM EST by telephone and verified that I am speaking with the correct person using two identifiers.  Location: Patient:  home Provider: office   I discussed the limitations, risks, security and privacy concerns of performing an evaluation and management service by telephone and the availability of in person appointments. I also discussed with the patient that there may be a patient responsible charge related to this service. The patient expressed understanding and agreed to proceed.   History of Present Illness: F/u chronic problems, medication manmagement Labs outstanding C/o pbesity , challenged in weight loss effort Denies recent fever or chills. Denies sinus pressure, nasal congestion, ear pain or sore throat. Denies chest congestion, productive cough or wheezing. Denies chest pains, palpitations and leg swelling Denies abdominal pain, nausea, vomiting,diarrhea or constipation.   Denies dysuria, frequency, hesitancy or incontinence. Denies joint pain, swelling and limitation in mobility. Denies headaches, seizures, numbness, or tingling. Denies depression, anxiety or insomnia. Denies skin break down or rash.       Observations/Objective: BP 127/80   Ht 5\' 5"  (1.651 m)   Wt 241 lb (109.3 kg)   BMI 40.10 kg/m  Good communication with no confusion and intact memory. Alert and oriented x 3 No signs of respiratory distress during speech    Assessment and Plan: Hypothyroidism, postradioiodine therapy Updated lab needed , past due and on supplemental medication   Morbid obesity  Patient re-educated about  the importance of commitment to a  minimum of 150 minutes of exercise per week as able.  The importance of healthy food choices with portion control discussed, as well as eating regularly and within a 12 hour window most days. The need to choose "clean , green" food 50 to 75% of  the time is discussed, as well as to make water the primary drink and set a goal of 64 ounces water daily.    Weight /BMI 06/13/2019 11/24/2017 09/14/2017  WEIGHT 241 lb 239 lb 0.6 oz 251 lb  HEIGHT 5\' 5"  5\' 6"  5\' 6"   BMI 40.1 kg/m2 38.58 kg/m2 40.51 kg/m2      IGT (impaired glucose tolerance) Patient educated about the importance of limiting  Carbohydrate intake , the need to commit to daily physical activity for a minimum of 30 minutes , and to commit weight loss. The fact that changes in all these areas will reduce or eliminate all together the development of diabetes is stressed.   Diabetic Labs Latest Ref Rng & Units 09/13/2017 04/28/2016 11/03/2015 04/30/2014 01/16/2014  HbA1c <5.7 % of total Hgb 5.9(H) 5.7(H) 5.8(H) 6.2(H) 6.1(H)  Chol 125 - 200 mg/dL - - 170 - 159  HDL >=46 mg/dL - - 53 - 49  Calc LDL <130 mg/dL - - 106 - 96  Triglycerides <150 mg/dL - - 54 - 70  Creatinine 0.50 - 1.10 mg/dL 0.75 - - - 0.66   BP/Weight 06/13/2019 11/24/2017 09/14/2017 05/04/2016 12/02/2015 11/05/2015 123456  Systolic BP [id number] AB-123456789 Q000111Q 123456 [id number]  Diastolic BP 80 74 82 82 70 82 90  Wt. (Lbs) 241 239.04 251 246 243 248 239  BMI 40.1 38.58 40.51 39.71 39.24 41.27 39.77   No flowsheet data found.  Updated lab needed at/ before next visit.     Follow Up Instructions:    I discussed the assessment and treatment plan with the patient. The patient was provided an opportunity to ask questions and all  were answered. The patient agreed with the plan and demonstrated an understanding of the instructions.   The patient was advised to call back or seek an in-person evaluation if the symptoms worsen or if the condition fails to improve as anticipated.  I provided 10 minutes of non-face-to-face time during this encounter.   Tula Nakayama, MD

## 2019-06-17 ENCOUNTER — Encounter: Payer: Self-pay | Admitting: Family Medicine

## 2019-06-17 NOTE — Assessment & Plan Note (Signed)
Updated lab needed , past due and on supplemental medication

## 2019-06-17 NOTE — Assessment & Plan Note (Signed)
Patient educated about the importance of limiting  Carbohydrate intake , the need to commit to daily physical activity for a minimum of 30 minutes , and to commit weight loss. The fact that changes in all these areas will reduce or eliminate all together the development of diabetes is stressed.   Diabetic Labs Latest Ref Rng & Units 09/13/2017 04/28/2016 11/03/2015 04/30/2014 01/16/2014  HbA1c <5.7 % of total Hgb 5.9(H) 5.7(H) 5.8(H) 6.2(H) 6.1(H)  Chol 125 - 200 mg/dL - - 170 - 159  HDL >=46 mg/dL - - 53 - 49  Calc LDL <130 mg/dL - - 106 - 96  Triglycerides <150 mg/dL - - 54 - 70  Creatinine 0.50 - 1.10 mg/dL 0.75 - - - 0.66   BP/Weight 06/13/2019 11/24/2017 09/14/2017 05/04/2016 12/02/2015 11/05/2015 123456  Systolic BP [id number] AB-123456789 Q000111Q 123456 [id number]  Diastolic BP 80 74 82 82 70 82 90  Wt. (Lbs) 241 239.04 251 246 243 248 239  BMI 40.1 38.58 40.51 39.71 39.24 41.27 39.77   No flowsheet data found.  Updated lab needed at/ before next visit.

## 2019-06-17 NOTE — Assessment & Plan Note (Signed)
  Patient re-educated about  the importance of commitment to a  minimum of 150 minutes of exercise per week as able.  The importance of healthy food choices with portion control discussed, as well as eating regularly and within a 12 hour window most days. The need to choose "clean , green" food 50 to 75% of the time is discussed, as well as to make water the primary drink and set a goal of 64 ounces water daily.    Weight /BMI 06/13/2019 11/24/2017 09/14/2017  WEIGHT 241 lb 239 lb 0.6 oz 251 lb  HEIGHT 5\' 5"  5\' 6"  5\' 6"   BMI 40.1 kg/m2 38.58 kg/m2 40.51 kg/m2

## 2019-07-09 ENCOUNTER — Other Ambulatory Visit: Payer: Self-pay | Admitting: Family Medicine

## 2019-07-10 MED ORDER — LEVOTHYROXINE SODIUM 150 MCG PO TABS: 150.0000 ug | ORAL_TABLET | Freq: Every day | ORAL | 0 refills | Status: DC

## 2019-07-11 ENCOUNTER — Encounter: Payer: Self-pay | Admitting: Family Medicine

## 2019-07-11 LAB — BASIC METABOLIC PANEL WITH GFR
BUN: 20 mg/dL (ref 7–25)
CO2: 27 mmol/L (ref 20–32)
Calcium: 9.3 mg/dL (ref 8.6–10.2)
Chloride: 103 mmol/L (ref 98–110)
Creat: 0.7 mg/dL (ref 0.50–1.10)
GFR, Est African American: 123 mL/min/{1.73_m2} (ref >=60)
GFR, Est Non African American: 106 mL/min/{1.73_m2} (ref >=60)
Glucose, Bld: 107 mg/dL — ABNORMAL HIGH (ref 65–99)
Potassium: 3.8 mmol/L (ref 3.5–5.3)
Sodium: 137 mmol/L (ref 135–146)

## 2019-07-11 LAB — LIPID PANEL
Cholesterol: 168 mg/dL (ref <200)
HDL: 49 mg/dL — ABNORMAL LOW (ref >=50)
LDL Cholesterol (Calc): 105 mg/dL (calc) — ABNORMAL HIGH
Non-HDL Cholesterol (Calc): 119 mg/dL (calc) (ref <130)
Total CHOL/HDL Ratio: 3.4 (calc) (ref <5.0)
Triglycerides: 59 mg/dL (ref <150)

## 2019-07-11 LAB — IRON: Iron: 14 ug/dL — ABNORMAL LOW (ref 40–190)

## 2019-07-11 LAB — CBC
HCT: 31 % — ABNORMAL LOW (ref 35.0–45.0)
Hemoglobin: 9 g/dL — ABNORMAL LOW (ref 11.7–15.5)
MCH: 21.1 pg — ABNORMAL LOW (ref 27.0–33.0)
MCHC: 29 g/dL — ABNORMAL LOW (ref 32.0–36.0)
MCV: 72.6 fL — ABNORMAL LOW (ref 80.0–100.0)
MPV: 10.6 fL (ref 7.5–12.5)
Platelets: 362 10*3/uL (ref 140–400)
RBC: 4.27 10*6/uL (ref 3.80–5.10)
RDW: 17.3 % — ABNORMAL HIGH (ref 11.0–15.0)
WBC: 4.9 10*3/uL (ref 3.8–10.8)

## 2019-07-11 LAB — TSH: TSH: 1.21 mIU/L

## 2019-07-11 LAB — HEMOGLOBIN A1C
Hgb A1c MFr Bld: 5.9 % of total Hgb — ABNORMAL HIGH (ref <5.7)
Mean Plasma Glucose: 123 (calc)
eAG (mmol/L): 6.8 (calc)

## 2019-12-12 ENCOUNTER — Ambulatory Visit: Payer: Self-pay | Admitting: Family Medicine

## 2019-12-18 ENCOUNTER — Ambulatory Visit: Payer: Self-pay | Admitting: Family Medicine

## 2020-04-01 ENCOUNTER — Other Ambulatory Visit: Payer: Self-pay

## 2020-04-01 MED ORDER — LEVOTHYROXINE SODIUM 150 MCG PO TABS: 150.0000 ug | ORAL_TABLET | Freq: Every day | ORAL | 0 refills | Status: DC

## 2020-04-11 ENCOUNTER — Encounter: Payer: Self-pay | Admitting: Family Medicine

## 2020-04-11 ENCOUNTER — Other Ambulatory Visit: Payer: Self-pay | Admitting: Family Medicine

## 2020-04-14 MED ORDER — LEVOTHYROXINE SODIUM 150 MCG PO TABS: 150.0000 ug | ORAL_TABLET | Freq: Every day | ORAL | 0 refills | Status: DC

## 2020-04-16 ENCOUNTER — Encounter: Payer: Self-pay | Admitting: Family Medicine

## 2020-04-16 ENCOUNTER — Other Ambulatory Visit: Payer: Self-pay

## 2020-04-16 ENCOUNTER — Telehealth (INDEPENDENT_AMBULATORY_CARE_PROVIDER_SITE_OTHER): Payer: Self-pay | Admitting: Family Medicine

## 2020-04-16 VITALS — BP 126/78 | HR 98 | Resp 16 | Ht 65.0 in | Wt 249.0 lb

## 2020-04-16 DIAGNOSIS — D500 Iron deficiency anemia secondary to blood loss (chronic): Secondary | ICD-10-CM

## 2020-04-16 DIAGNOSIS — Z0289 Encounter for other administrative examinations: Secondary | ICD-10-CM

## 2020-04-16 NOTE — Patient Instructions (Addendum)
F/u in office with MD in 6 months, call if you need me before  Please get cBC, iron , ferritin , B12 and TSH as soon as possible  Forms will be completed once all information is in,I await the "update " that you have   The form to exempt you from the flu vaccine will be signed  Thanks for choosing [facility], we consider it a privelige to serve you.

## 2020-04-16 NOTE — Progress Notes (Signed)
Virtual Visit via Telephone Note  I connected with Doris Romero on 04/16/20 at  2:40 PM EDT by telephone and verified that I am speaking with the correct person using two identifiers.  Location: Patient: home Provider: office   I discussed the limitations, risks, security and privacy concerns of performing an evaluation and management service by telephone and the availability of in person appointments. I also discussed with the patient that there may be a patient responsible charge related to this service. The patient expressed understanding and agreed to proceed.   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 I will look into what paperwork she has access to which would exempt her from the Covid vaccine, she is statin that there is documentation to support exemption because of a known allergy to the influenza vaccine, I am unaware but will look furhter into this. I also ask that she provide the documentation supporting this for my review, she understands and agrees    Observations/Objective: Vitals: per record review and weight per pt reporting Good communication with no confusion and intact memory. Alert and oriented x 3 No signs of respiratory distress during speech    Assessment and Plan:  Encounter for completion of form with patient Visit note as documented, will await updated info re exemption for Covid vaccine, as well as check on website  Heavy menses persits , updated iron and B12  Needed, reports fatigue   Follow Up Instructions:    I discussed the assessment and treatment plan with the patient. The patient was provided an opportunity to ask questions and all were answered. The patient agreed with the plan and demonstrated an understanding of the  instructions.   The patient was advised to call back or seek an in-person evaluation if the symptoms worsen or if the condition fails to improve as anticipated.  I provided 10  minutes of non-face-to-face time during this encounter.   Tula Nakayama, MD

## 2020-04-16 NOTE — Assessment & Plan Note (Signed)
Visit note as documented, will await updated info re exemption for Covid vaccine, as well as check on website

## 2020-04-16 NOTE — Assessment & Plan Note (Signed)
persits , updated iron and B12  Needed, reports fatigue

## 2020-04-17 ENCOUNTER — Encounter: Payer: Self-pay | Admitting: Family Medicine

## 2020-04-21 ENCOUNTER — Telehealth: Payer: Self-pay

## 2020-04-21 ENCOUNTER — Other Ambulatory Visit: Payer: Self-pay

## 2020-04-21 DIAGNOSIS — T7840XA Allergy, unspecified, initial encounter: Secondary | ICD-10-CM

## 2020-04-21 NOTE — Telephone Encounter (Signed)
Talked with patient this am and let her know Dr Moshe Cipro has approved a referral to the allergy Physician and she can work with him if they feel she is not able to take the Covid shot.  I told her Dr Moshe Cipro will not write a letter for this.  She ask me to mail the referral to her and I have [place] mailed a copy of the referral.

## 2020-04-23 ENCOUNTER — Ambulatory Visit: Payer: Self-pay | Admitting: Allergy & Immunology

## 2020-06-24 ENCOUNTER — Other Ambulatory Visit: Payer: Self-pay

## 2020-06-24 MED ORDER — LEVOTHYROXINE SODIUM 150 MCG PO TABS: 150.0000 ug | ORAL_TABLET | Freq: Every day | ORAL | 0 refills | Status: DC

## 2020-07-28 ENCOUNTER — Other Ambulatory Visit: Payer: Self-pay | Admitting: Family Medicine

## 2020-07-28 MED ORDER — LEVOTHYROXINE SODIUM 150 MCG PO TABS: 150.0000 ug | ORAL_TABLET | Freq: Every day | ORAL | 0 refills | Status: DC

## 2020-07-29 ENCOUNTER — Other Ambulatory Visit: Payer: Self-pay

## 2020-07-29 MED ORDER — LEVOTHYROXINE SODIUM 150 MCG PO TABS: 150.0000 ug | ORAL_TABLET | Freq: Every day | ORAL | 0 refills | Status: DC

## 2020-10-15 ENCOUNTER — Ambulatory Visit: Payer: Self-pay | Admitting: Family Medicine

## 2021-01-08 ENCOUNTER — Telehealth: Payer: Self-pay | Admitting: Family Medicine

## 2021-01-08 ENCOUNTER — Other Ambulatory Visit: Payer: Self-pay | Admitting: Family Medicine

## 2021-01-08 NOTE — Telephone Encounter (Signed)
Refilled med

## 2021-01-08 NOTE — Telephone Encounter (Signed)
Pt needs levothyroxine called in

## 2021-03-18 ENCOUNTER — Telehealth: Payer: Self-pay

## 2021-03-18 ENCOUNTER — Other Ambulatory Visit: Payer: Self-pay | Admitting: Family Medicine

## 2021-03-19 NOTE — Telephone Encounter (Signed)
error 

## 2022-04-29 ENCOUNTER — Telehealth: Payer: Self-pay

## 2022-04-29 NOTE — Telephone Encounter (Signed)
Patient called need med refill  levothyroxine (SYNTHROID) 150 MCG tablet [[id number]]   Pharmacy: [facility]

## 2022-05-25 ENCOUNTER — Encounter: Payer: Self-pay | Admitting: Family Medicine

## 2022-05-25 ENCOUNTER — Ambulatory Visit: Payer: Self-pay | Admitting: Family Medicine

## 2023-03-14 ENCOUNTER — Telehealth: Payer: Self-pay | Admitting: Family Medicine

## 2023-03-14 NOTE — Telephone Encounter (Signed)
Patient calling to see if Dr Lodema Hong will reconsider taking her back on as her patient. Patient call back # [phone]

## 2023-03-15 NOTE — Telephone Encounter (Signed)
Patient advise.

## 2023-03-18 NOTE — Telephone Encounter (Signed)
Patient ok to establish care with preferred provider.

## 2023-03-21 NOTE — Telephone Encounter (Signed)
Called patient left a voicemail to contact our office to schedule an appointment with another provider.

## 2023-04-20 ENCOUNTER — Ambulatory Visit (HOSPITAL_COMMUNITY)
Admission: RE | Admit: 2023-04-20 | Discharge: 2023-04-20 | Disposition: A | Discharge status: Home / Self Care | Payer: Commercial Managed Care - PPO | Source: Ambulatory Visit | Attending: Family Medicine | Admitting: Family Medicine

## 2023-04-20 ENCOUNTER — Encounter: Payer: Self-pay | Admitting: Family Medicine

## 2023-04-20 ENCOUNTER — Ambulatory Visit: Payer: Commercial Managed Care - PPO | Admitting: Family Medicine

## 2023-04-20 ENCOUNTER — Ambulatory Visit (INDEPENDENT_AMBULATORY_CARE_PROVIDER_SITE_OTHER): Payer: Commercial Managed Care - PPO | Admitting: Family Medicine

## 2023-04-20 VITALS — BP 132/88 | HR 97 | Ht 65.0 in | Wt 255.1 lb

## 2023-04-20 DIAGNOSIS — N926 Irregular menstruation, unspecified: Secondary | ICD-10-CM | POA: Diagnosis not present

## 2023-04-20 DIAGNOSIS — M25512 Pain in left shoulder: Secondary | ICD-10-CM

## 2023-04-20 DIAGNOSIS — E785 Hyperlipidemia, unspecified: Secondary | ICD-10-CM

## 2023-04-20 DIAGNOSIS — R7301 Impaired fasting glucose: Secondary | ICD-10-CM

## 2023-04-20 DIAGNOSIS — Z114 Encounter for screening for human immunodeficiency virus [HIV]: Secondary | ICD-10-CM

## 2023-04-20 DIAGNOSIS — E890 Postprocedural hypothyroidism: Secondary | ICD-10-CM

## 2023-04-20 DIAGNOSIS — Z1159 Encounter for screening for other viral diseases: Secondary | ICD-10-CM

## 2023-04-20 DIAGNOSIS — M2550 Pain in unspecified joint: Secondary | ICD-10-CM

## 2023-04-20 DIAGNOSIS — E559 Vitamin D deficiency, unspecified: Secondary | ICD-10-CM

## 2023-04-20 NOTE — Assessment & Plan Note (Signed)
The patient reports needing to change her super plus tampon and peripad hourly during her menstrual cycle. She notes that her menses, which used to be irregular, are now regular, lasting about 10 days, with 3 days of heavy bleeding. No clots have been reported, and she denies symptoms of lightheadedness or dizziness.  It is noted that the patient tends to be anemic at baseline, and she takes an over-the-counter iron supplement. She reports experiencing heavy menses for the past few months and would like to be evaluated by GYN, as she was previously noted to have fibroids. She is not currently on her menstrual cycle today.  A referral has been placed to GYN for collaborative care.

## 2023-04-20 NOTE — Assessment & Plan Note (Addendum)
The patient complains of joint pain, stiffness, and achiness, with some swelling noted around the time of her menses. No redness, fever, chills, or tenderness is reported upon palpation of her knee joints today, and no recent injury or trauma to the affected joints has been noted.  We will assess for autoimmune disease today. Nonpharmacological management is recommended, including alternating heat and cold applications, avoiding aggravating activities, and performing stretching and strengthening exercises. The patient is encouraged to continue taking over-the-counter analgesics for pain relief.

## 2023-04-20 NOTE — Patient Instructions (Addendum)
I appreciate the opportunity to provide care to you today!    Follow up:  4 months  Labs: please stop by the lab today to get your blood drawn (CBC, CMP, TSH, Lipid profile, HgA1c, Vit D)  Screening: HIV and Hep C  Here are some nonpharmacological interventions for managing shoulder pain and knee pain:  Shoulder Pain: -Rest and Activity Modification: Avoid activities that aggravate the shoulder pain, such as lifting heavy objects or repetitive overhead movements. -Ice/Heat Therapy: Apply ice packs for 15-20 minutes a few times a day during the acute phase of pain. Heat can be used after a few days to relax and loosen muscles. -Stretching and Strengthening Exercises: Gentle range-of-motion exercises can help maintain flexibility. Strengthening exercises, focusing on the rotator cuff and shoulder muscles, can prevent future injuries. -Posture Improvement: Ensure proper posture when sitting or standing to reduce strain on the shoulder joint. -Physical Therapy: A physical therapist can guide you through targeted exercises to reduce pain, improve mobility, and strengthen shoulder muscles. -Ergonomic Adjustments: Adjust your workstation or sleeping position to prevent strain on your shoulder. -Massage Therapy: Gentle massage may help relieve tension and improve blood flow to the affected area. Knee Pain: -Rest and Avoid Overuse: Limit activities that stress the knee joint, such as running or jumping. Consider switching to low-impact activities like swimming or cycling. -Ice/Heat Therapy: Apply ice packs for 15-20 minutes at a time to reduce inflammation. Heat therapy can help loosen stiff muscles around the knee. -Weight Management: If applicable, losing excess weight can significantly reduce the stress on the knee joints. -Strengthening Exercises: Strengthening the muscles around the knee, particularly the quadriceps and hamstrings, can help provide better support for the joint. -Stretching and  Range-of-Motion Exercises: Gentle stretches can improve flexibility and reduce stiffness in the knee. -Knee Braces or Supports: Wearing a knee brace can provide extra support and stability, especially during physical activity. -Physical Therapy: A physical therapist can design a personalized program to strengthen the muscles and improve joint function. -Proper Footwear: Wearing shoes with good arch support or using orthotics can improve knee alignment and reduce pain. -Low-Impact Exercise: Activities like swimming, water aerobics, and cycling can help maintain fitness without putting too much stress on the knee. -Both conditions benefit greatly from consistent exercise, lifestyle changes, and physical therapy when necessary.  Recommendations for Weight Loss Management: Emphasize Lifestyle Changes: A heart-healthy diet and increased physical activity are crucial. Healthy Tips for Weight Loss: Increase Intake of Nutrient-Rich Foods: Prioritize fruits, vegetables, and whole grains. Incorporate Lean Proteins: Include chicken, fish, beans, and legumes in your diet. Choose Low-Fat Dairy Products: Opt for dairy products that are low in fat. Reduce Unhealthy Fats: Limit saturated fats, trans fatty acids, and cholesterol. Aim for Regular Physical Activity: Engage in at least 30 minutes of brisk walking or other physical activities on at least 5 days a week.    Please stop by your local pharmacy and get your Tdap and Shingles vaccine   Attached with your AVS, you will find valuable resources for self-education. I highly recommend dedicating some time to thoroughly examine them.   Please continue to a heart-healthy diet and increase your physical activities. Try to exercise for at least five days a week.    It was a pleasure to see you and I look forward to continuing to work together on your health and well-being. Please do not hesitate to call the office if you need care or have questions  about your care.  In  case of emergency, please visit the Emergency Department for urgent care, or contact our clinic at [phone] to schedule an appointment. We're here to help you!   Have a wonderful day and week. With Gratitude, Gilmore Laroche MSN, FNP-BC
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
## 2023-04-21 LAB — CBC WITH DIFFERENTIAL/PLATELET
Basophils Absolute: 0 10*3/uL (ref 0.0–0.2)
Basos: 1 %
EOS (ABSOLUTE): 0.1 10*3/uL (ref 0.0–0.4)
Eos: 2 %
Hematocrit: 31.2 % — ABNORMAL LOW (ref 34.0–46.6)
Hemoglobin: 8.4 g/dL — ABNORMAL LOW (ref 11.1–15.9)
Immature Grans (Abs): 0 10*3/uL (ref 0.0–0.1)
Immature Granulocytes: 0 %
Lymphocytes Absolute: 2 10*3/uL (ref 0.7–3.1)
Lymphs: 40 %
MCH: 19.6 pg — ABNORMAL LOW (ref 26.6–33.0)
MCHC: 26.9 g/dL — ABNORMAL LOW (ref 31.5–35.7)
MCV: 73 fL — ABNORMAL LOW (ref 79–97)
Monocytes Absolute: 0.5 10*3/uL (ref 0.1–0.9)
Monocytes: 10 %
Neutrophils Absolute: 2.3 10*3/uL (ref 1.4–7.0)
Neutrophils: 47 %
Platelets: 426 10*3/uL (ref 150–450)
RBC: 4.28 x10E6/uL (ref 3.77–5.28)
RDW: 15.9 % — ABNORMAL HIGH (ref 11.7–15.4)
WBC: 5 10*3/uL (ref 3.4–10.8)

## 2023-04-21 LAB — CMP14+EGFR
ALT: 11 [IU]/L (ref 0–32)
AST: 17 [IU]/L (ref 0–40)
Albumin: 4.1 g/dL (ref 3.9–4.9)
Alkaline Phosphatase: 55 [IU]/L (ref 44–121)
BUN/Creatinine Ratio: 21 (ref 9–23)
BUN: 15 mg/dL (ref 6–24)
Bilirubin Total: <0.2 mg/dL (ref 0.0–1.2)
CO2: 23 mmol/L (ref 20–29)
Calcium: 8.8 mg/dL (ref 8.7–10.2)
Chloride: 101 mmol/L (ref 96–106)
Creatinine, Ser: 0.72 mg/dL (ref 0.57–1.00)
Globulin, Total: 3.3 g/dL (ref 1.5–4.5)
Glucose: 114 mg/dL — ABNORMAL HIGH (ref 70–99)
Potassium: 3.9 mmol/L (ref 3.5–5.2)
Sodium: 138 mmol/L (ref 134–144)
Total Protein: 7.4 g/dL (ref 6.0–8.5)
eGFR: 104 mL/min/{1.73_m2} (ref >59)

## 2023-04-21 LAB — VITAMIN D 25 HYDROXY (VIT D DEFICIENCY, FRACTURES): Vit D, 25-Hydroxy: 28 ng/mL — ABNORMAL LOW (ref 30.0–100.0)

## 2023-04-21 LAB — LIPID PANEL
Chol/HDL Ratio: 3.2 ratio (ref 0.0–4.4)
Cholesterol, Total: 164 mg/dL (ref 100–199)
HDL: 52 mg/dL (ref >39)
LDL Chol Calc (NIH): 97 mg/dL (ref 0–99)
Triglycerides: 82 mg/dL (ref 0–149)
VLDL Cholesterol Cal: 15 mg/dL (ref 5–40)

## 2023-04-21 LAB — HEMOGLOBIN A1C
Est. average glucose Bld gHb Est-mCnc: 143 mg/dL
Hgb A1c MFr Bld: 6.6 % — ABNORMAL HIGH (ref 4.8–5.6)

## 2023-04-21 LAB — TSH+FREE T4
Free T4: 1.32 ng/dL (ref 0.82–1.77)
TSH: 4.28 u[IU]/mL (ref 0.450–4.500)

## 2023-04-21 LAB — HIV ANTIBODY (ROUTINE TESTING W REFLEX): HIV Screen 4th Generation wRfx: NONREACTIVE

## 2023-04-21 LAB — HEPATITIS C ANTIBODY: Hep C Virus Ab: NONREACTIVE

## 2023-04-21 NOTE — Assessment & Plan Note (Signed)
The patient reports discomfort when lying on her left shoulder and believes her achiness and current symptoms are likely positional. She denies any recent injury or trauma to the left shoulder. She complains of mild achiness when extending her left shoulder overhead. No numbness, tingling, or weakness is reported in the affected arm, and no deformity is observed. Range of motion is intact.  An imaging study will be conducted today, as this has been an ongoing complaint for the patient. Supportive care is encouraged, including strengthening exercises for the left shoulder, heat application to the affected area, and the use of over-the-counter analgesics for pain relief. The patient declines physical therapy at this time due to work commitments.

## 2023-04-21 NOTE — Assessment & Plan Note (Signed)
The patient is concerned about her weight gain and admits to minimal physical activity and unhealthy dietary habits. She was previously on phentermine and had follow-up appointments with a nutritionist.  Lifestyle changes, including a heart-healthy diet and increased physical activity, were recommended. Healthy tips for weight loss were provided to support her goals.  Eat three meals per day at times discussed. Cut out all diet bevergages and drink only water Eat whole food plant based meals Cut out junk food, fast food and processed foods Exercise 150 minutes a week Lose 1-2 lbs per week. Keep a food journal Choose foods that grow in a garden or in a fruit orchard and protein of animals with fins or feathers.  Lifestyle Medicine - Whole Food, Plant Predominant Nutrition is highly recommended: Eat Plenty of vegetables, Mushrooms, fruits, Legumes, Whole Grains, Nuts, seeds in lieu of processed meats, processed snacks/pastries red meat, poultry, eggs.  -It is better to avoid simple carbohydrates including: Cakes, Sweet Desserts, Ice Cream, Soda (diet and regular), Sweet Tea, Candies, Chips, Cookies, Store Bought Juices, Alcohol in Excess of  1-2 drinks a day, Lemonade,  Artificial Sweeteners, Doughnuts, Coffee Creamers, "Sugar-free" Products, etc, etc.  This is not a complete list..... Exercise: If you are able: 30 -60 minutes a day ,4 days a week, or 150 minutes a week.  The longer the better.  Combine stretch, strength, and aerobic activities.  If you were told in the past that you have high risk for cardiovascular diseases, you may seek evaluation by your heart doctor prior to initiating moderate to intense exercise programs.

## 2023-04-21 NOTE — Assessment & Plan Note (Deleted)
Chronic problem. 

## 2023-04-23 LAB — ANA: Anti Nuclear Antibody (ANA): NEGATIVE

## 2023-04-23 LAB — RHEUMATOID FACTOR: Rheumatoid fact SerPl-aCnc: <10 [IU]/mL (ref <14.0)

## 2023-04-23 LAB — CELIAC AB TTG DGP TIGA
Antigliadin Abs, IgA: 4 U (ref 0–19)
Gliadin IgG: 4 U (ref 0–19)
IgA/Immunoglobulin A, Serum: 282 mg/dL (ref 87–352)
Tissue Transglut Ab: 4 U/mL (ref 0–5)
Transglutaminase IgA: <2 U/mL (ref 0–3)

## 2023-04-23 LAB — SPECIMEN STATUS REPORT

## 2023-04-26 ENCOUNTER — Other Ambulatory Visit: Payer: Self-pay | Admitting: Family Medicine

## 2023-04-26 DIAGNOSIS — D508 Other iron deficiency anemias: Secondary | ICD-10-CM

## 2023-04-26 DIAGNOSIS — E1165 Type 2 diabetes mellitus with hyperglycemia: Secondary | ICD-10-CM

## 2023-04-26 DIAGNOSIS — E559 Vitamin D deficiency, unspecified: Secondary | ICD-10-CM

## 2023-04-26 MED ORDER — VITAMIN D (ERGOCALCIFEROL) 1.25 MG (50000 UNIT) PO CAPS: 50000.0000 [IU] | ORAL_CAPSULE | ORAL | 1 refills | Status: AC

## 2023-04-26 MED ORDER — METFORMIN HCL 500 MG PO TABS
500.0000 mg | ORAL_TABLET | Freq: Two times a day (BID) | ORAL | 3 refills | Status: DC
Start: 2023-04-26 — End: 2023-12-04

## 2023-04-26 MED ORDER — FERROUS SULFATE 325 (65 FE) MG PO TABS: 325.0000 mg | ORAL_TABLET | Freq: Every day | ORAL | 1 refills | Status: DC

## 2023-05-08 ENCOUNTER — Other Ambulatory Visit: Payer: Self-pay | Admitting: Family Medicine

## 2023-05-08 DIAGNOSIS — M958 Other specified acquired deformities of musculoskeletal system: Secondary | ICD-10-CM

## 2023-06-06 ENCOUNTER — Encounter: Payer: Self-pay | Admitting: Advanced Practice Midwife

## 2023-06-06 ENCOUNTER — Other Ambulatory Visit (HOSPITAL_COMMUNITY)
Admission: RE | Admit: 2023-06-06 | Discharge: 2023-06-06 | Disposition: A | Discharge status: Home / Self Care | Payer: Commercial Managed Care - PPO | Source: Ambulatory Visit | Attending: Advanced Practice Midwife | Admitting: Advanced Practice Midwife

## 2023-06-06 ENCOUNTER — Ambulatory Visit: Payer: Commercial Managed Care - PPO | Admitting: Advanced Practice Midwife

## 2023-06-06 VITALS — BP 146/76 | HR 97 | Ht 66.0 in | Wt 264.0 lb

## 2023-06-06 DIAGNOSIS — Z01419 Encounter for gynecological examination (general) (routine) without abnormal findings: Secondary | ICD-10-CM | POA: Diagnosis present

## 2023-06-06 DIAGNOSIS — N924 Excessive bleeding in the premenopausal period: Secondary | ICD-10-CM

## 2023-06-06 DIAGNOSIS — N926 Irregular menstruation, unspecified: Secondary | ICD-10-CM

## 2023-06-06 DIAGNOSIS — Z1231 Encounter for screening mammogram for malignant neoplasm of breast: Secondary | ICD-10-CM

## 2023-06-06 DIAGNOSIS — Z1211 Encounter for screening for malignant neoplasm of colon: Secondary | ICD-10-CM | POA: Diagnosis not present

## 2023-06-06 NOTE — Patient Instructions (Signed)
Call [phone] to schedule mammogram °

## 2023-06-06 NOTE — Addendum Note (Signed)
Addended by: Moss Mc on: 06/06/2023 11:48 AM   Modules accepted: Orders
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
## 2023-06-07 LAB — CYTOLOGY - PAP
Adequacy: ABSENT
Chlamydia: NEGATIVE
Comment: NEGATIVE
Comment: NEGATIVE
Comment: NEGATIVE
Comment: NORMAL
Diagnosis: NEGATIVE
High risk HPV: NEGATIVE
Neisseria Gonorrhea: NEGATIVE
Trichomonas: NEGATIVE

## 2023-06-08 ENCOUNTER — Encounter: Payer: Self-pay | Admitting: *Deleted

## 2023-07-07 ENCOUNTER — Ambulatory Visit (INDEPENDENT_AMBULATORY_CARE_PROVIDER_SITE_OTHER): Payer: Commercial Managed Care - PPO | Admitting: Obstetrics & Gynecology

## 2023-07-07 ENCOUNTER — Ambulatory Visit (INDEPENDENT_AMBULATORY_CARE_PROVIDER_SITE_OTHER): Payer: Self-pay

## 2023-07-07 ENCOUNTER — Encounter: Payer: Self-pay | Admitting: Obstetrics & Gynecology

## 2023-07-07 VITALS — BP 153/86 | HR 93 | Ht 66.0 in | Wt 260.0 lb

## 2023-07-07 DIAGNOSIS — D219 Benign neoplasm of connective and other soft tissue, unspecified: Secondary | ICD-10-CM

## 2023-07-07 DIAGNOSIS — N924 Excessive bleeding in the premenopausal period: Secondary | ICD-10-CM

## 2023-07-07 DIAGNOSIS — D500 Iron deficiency anemia secondary to blood loss (chronic): Secondary | ICD-10-CM

## 2023-07-07 DIAGNOSIS — N926 Irregular menstruation, unspecified: Secondary | ICD-10-CM

## 2023-07-07 DIAGNOSIS — Z01419 Encounter for gynecological examination (general) (routine) without abnormal findings: Secondary | ICD-10-CM

## 2023-07-07 MED ORDER — MEGESTROL ACETATE 40 MG PO TABS: 40.0000 mg | ORAL_TABLET | Freq: Every day | ORAL | 11 refills | Status: AC

## 2023-07-07 MED ORDER — MEGESTROL ACETATE 40 MG PO TABS: ORAL_TABLET | ORAL | 0 refills | Status: DC
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
## 2023-07-07 NOTE — Progress Notes (Signed)
 PELVIC US  TA/TV:enlarged heterogeneous anteverted uterus with multiple fibroid,(#1) fundal right 8 x 6.4 x 7.7 cm,(#2) anterior left 7.6 x 7.2 x 5.4 cm,(#3) posterior subserosal fibroid 3.8 x 3.8 x 4.9 cm,EEC 13 mm,normal ovaries (limited view),no free fluid,no pain during ultrasound  Chaperone Kelia

## 2023-07-17 NOTE — Code Documentation (Signed)
I put in the documentation ' Thank you  Dr Despina Hidden

## 2023-07-21 NOTE — Code Documentation (Signed)
MyChart video connect visit I am in my office Pt is at home total time: 10 minutes  Dr Despina Hidden

## 2023-08-02 ENCOUNTER — Other Ambulatory Visit: Payer: Self-pay | Admitting: Obstetrics & Gynecology

## 2023-08-18 ENCOUNTER — Ambulatory Visit: Payer: Self-pay | Admitting: Obstetrics & Gynecology

## 2023-08-25 ENCOUNTER — Ambulatory Visit: Payer: Self-pay | Admitting: Obstetrics & Gynecology

## 2023-09-22 ENCOUNTER — Encounter: Payer: Self-pay | Admitting: Obstetrics & Gynecology

## 2023-09-22 ENCOUNTER — Ambulatory Visit (INDEPENDENT_AMBULATORY_CARE_PROVIDER_SITE_OTHER): Payer: Self-pay | Admitting: Obstetrics & Gynecology

## 2023-09-22 ENCOUNTER — Other Ambulatory Visit: Payer: Self-pay | Admitting: Family Medicine

## 2023-09-22 VITALS — BP 145/87 | HR 90

## 2023-09-22 DIAGNOSIS — N924 Excessive bleeding in the premenopausal period: Secondary | ICD-10-CM

## 2023-09-22 DIAGNOSIS — D219 Benign neoplasm of connective and other soft tissue, unspecified: Secondary | ICD-10-CM

## 2023-09-22 DIAGNOSIS — D500 Iron deficiency anemia secondary to blood loss (chronic): Secondary | ICD-10-CM

## 2023-09-22 LAB — POCT HEMOGLOBIN: Hemoglobin: 6.9 g/dL — AB (ref 11–14.6)

## 2023-09-22 MED ORDER — MEGESTROL ACETATE 40 MG PO TABS
40.0000 mg | ORAL_TABLET | Freq: Every day | ORAL | 1 refills | Status: AC
Start: 2023-09-22 — End: ?
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
## 2023-09-22 NOTE — Telephone Encounter (Signed)
 Copied from CRM [id number]. Topic: Clinical - Medication Refill >> Sep 22, 2023  1:03 PM Alessandra Bevels wrote: Most Recent Primary Care Visit:  Provider: Gilmore Laroche  Department: [facility]  Visit Type: NEW PATIENT  Date: 04/20/2023  Medication: levothyroxine (SYNTHROID) 150 MCG tablet [[id number]] Appointment is scheduled 11/28/2023  Has the patient contacted their pharmacy? Yes (Agent: If no, request that the patient contact the pharmacy for the refill. If patient does not wish to contact the pharmacy document the reason why and proceed with request.) (Agent: If yes, when and what did the pharmacy advise?)  Is this the correct pharmacy for this prescription? Yes If no, delete pharmacy and type the correct one.  This is the patient's preferred pharmacy:  [facility] [address] [place] - [address] [place] [address] [place] [address] Phone: [phone] Fax: [phone]   Has the prescription been filled recently? Yes  Is the patient out of the medication? Yes  Has the patient been seen for an appointment in the last year OR does the patient have an upcoming appointment? Yes  Can we respond through MyChart? Yes  Agent: Please be advised that Rx refills may take up to 3 business days. We ask that you follow-up with your pharmacy.

## 2023-09-24 ENCOUNTER — Other Ambulatory Visit: Payer: Self-pay | Admitting: Family Medicine

## 2023-09-24 DIAGNOSIS — E038 Other specified hypothyroidism: Secondary | ICD-10-CM

## 2023-09-24 DIAGNOSIS — E559 Vitamin D deficiency, unspecified: Secondary | ICD-10-CM

## 2023-09-24 DIAGNOSIS — R7301 Impaired fasting glucose: Secondary | ICD-10-CM

## 2023-09-24 DIAGNOSIS — E7849 Other hyperlipidemia: Secondary | ICD-10-CM

## 2023-11-28 ENCOUNTER — Encounter: Payer: Self-pay | Admitting: Family Medicine

## 2023-11-28 ENCOUNTER — Ambulatory Visit (INDEPENDENT_AMBULATORY_CARE_PROVIDER_SITE_OTHER): Payer: Self-pay | Admitting: Family Medicine

## 2023-11-28 VITALS — BP 137/85 | HR 90 | Resp 18 | Ht 66.0 in | Wt 257.0 lb

## 2023-11-28 DIAGNOSIS — R7301 Impaired fasting glucose: Secondary | ICD-10-CM

## 2023-11-28 DIAGNOSIS — N924 Excessive bleeding in the premenopausal period: Secondary | ICD-10-CM

## 2023-11-28 DIAGNOSIS — E559 Vitamin D deficiency, unspecified: Secondary | ICD-10-CM

## 2023-11-28 DIAGNOSIS — E7849 Other hyperlipidemia: Secondary | ICD-10-CM

## 2023-11-28 DIAGNOSIS — E038 Other specified hypothyroidism: Secondary | ICD-10-CM

## 2023-11-28 MED ORDER — MEGESTROL ACETATE 40 MG PO TABS: ORAL_TABLET | ORAL | 11 refills | Status: AC
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
## 2023-11-28 NOTE — Patient Instructions (Addendum)
 I appreciate the opportunity to provide care to you today!    Follow up:  4 months  Labs: please stop by the lab today to get your blood drawn (CBC, CMP, TSH, Lipid profile, HgA1c, Vit D)   A prescription for megace  has been sent to your pharmacy  Please Seek Medical Attention Immediately: -Bleeding that soaks through multiple pads per hour -Severe abdominal pain -Signs of anemia (e.g., dizziness, weakness, fainting)    Please continue to a heart-healthy diet and increase your physical activities. Try to exercise for at least five days a week.    It was a pleasure to see you and I look forward to continuing to work together on your health and well-being. Please do not hesitate to call the office if you need care or have questions about your care.  In case of emergency, please visit the Emergency Department for urgent care, or contact our clinic at [phone] to schedule an appointment. We're here to help you!   Have a wonderful day and week. With Gratitude, Aliviya Schoeller MSN, FNP-BC

## 2023-11-29 LAB — CBC WITH DIFFERENTIAL/PLATELET
Basophils Absolute: 0 10*3/uL (ref 0.0–0.2)
Basos: 1 %
EOS (ABSOLUTE): 0.1 10*3/uL (ref 0.0–0.4)
Eos: 3 %
Hematocrit: 29.5 % — ABNORMAL LOW (ref 34.0–46.6)
Hemoglobin: 8.2 g/dL — ABNORMAL LOW (ref 11.1–15.9)
Immature Grans (Abs): 0 10*3/uL (ref 0.0–0.1)
Immature Granulocytes: 0 %
Lymphocytes Absolute: 1.7 10*3/uL (ref 0.7–3.1)
Lymphs: 39 %
MCH: 20.7 pg — ABNORMAL LOW (ref 26.6–33.0)
MCHC: 27.8 g/dL — ABNORMAL LOW (ref 31.5–35.7)
MCV: 75 fL — ABNORMAL LOW (ref 79–97)
Monocytes Absolute: 0.5 10*3/uL (ref 0.1–0.9)
Monocytes: 12 %
Neutrophils Absolute: 1.9 10*3/uL (ref 1.4–7.0)
Neutrophils: 45 %
Platelets: 346 10*3/uL (ref 150–450)
RBC: 3.96 x10E6/uL (ref 3.77–5.28)
RDW: 19.8 % — ABNORMAL HIGH (ref 11.7–15.4)
WBC: 4.3 10*3/uL (ref 3.4–10.8)

## 2023-11-29 LAB — CMP14+EGFR
ALT: 13 IU/L (ref 0–32)
AST: 19 IU/L (ref 0–40)
Albumin: 4.1 g/dL (ref 3.9–4.9)
Alkaline Phosphatase: 56 IU/L (ref 44–121)
BUN/Creatinine Ratio: 14 (ref 9–23)
BUN: 8 mg/dL (ref 6–24)
Bilirubin Total: <0.2 mg/dL (ref 0.0–1.2)
CO2: 23 mmol/L (ref 20–29)
Calcium: 8.8 mg/dL (ref 8.7–10.2)
Chloride: 102 mmol/L (ref 96–106)
Creatinine, Ser: 0.57 mg/dL (ref 0.57–1.00)
Globulin, Total: 2.9 g/dL (ref 1.5–4.5)
Glucose: 100 mg/dL — ABNORMAL HIGH (ref 70–99)
Potassium: 3.8 mmol/L (ref 3.5–5.2)
Sodium: 138 mmol/L (ref 134–144)
Total Protein: 7 g/dL (ref 6.0–8.5)
eGFR: 113 mL/min/{1.73_m2} (ref >59)

## 2023-11-29 LAB — TSH+FREE T4
Free T4: 1.94 ng/dL — ABNORMAL HIGH (ref 0.82–1.77)
TSH: 0.303 u[IU]/mL — ABNORMAL LOW (ref 0.450–4.500)

## 2023-11-29 LAB — HEMOGLOBIN A1C
Est. average glucose Bld gHb Est-mCnc: 108 mg/dL
Hgb A1c MFr Bld: 5.4 % (ref 4.8–5.6)

## 2023-11-29 LAB — LIPID PANEL
Chol/HDL Ratio: 3.6 ratio (ref 0.0–4.4)
Cholesterol, Total: 143 mg/dL (ref 100–199)
HDL: 40 mg/dL (ref >39)
LDL Chol Calc (NIH): 89 mg/dL (ref 0–99)
Triglycerides: 70 mg/dL (ref 0–149)
VLDL Cholesterol Cal: 14 mg/dL (ref 5–40)

## 2023-11-29 LAB — VITAMIN D 25 HYDROXY (VIT D DEFICIENCY, FRACTURES): Vit D, 25-Hydroxy: 29.1 ng/mL — ABNORMAL LOW (ref 30.0–100.0)

## 2023-12-01 NOTE — Assessment & Plan Note (Addendum)
 Will initiate therapy with a Megace  taper. Encouraged to follow up with gyn as soon as possible The patient is encouraged to seek immediate medical attention if any of the following occur: Bleeding that soaks through multiple pads per hour Severe abdominal pain Signs of anemia, such as dizziness, weakness, or fainting

## 2023-12-02 ENCOUNTER — Encounter: Payer: Self-pay | Admitting: Family Medicine

## 2023-12-04 ENCOUNTER — Ambulatory Visit: Payer: Self-pay | Admitting: Family Medicine

## 2023-12-04 ENCOUNTER — Other Ambulatory Visit: Payer: Self-pay | Admitting: Family Medicine

## 2023-12-04 DIAGNOSIS — E1165 Type 2 diabetes mellitus with hyperglycemia: Secondary | ICD-10-CM

## 2023-12-04 DIAGNOSIS — E890 Postprocedural hypothyroidism: Secondary | ICD-10-CM

## 2023-12-04 DIAGNOSIS — D508 Other iron deficiency anemias: Secondary | ICD-10-CM

## 2023-12-04 MED ORDER — METFORMIN HCL 500 MG PO TABS: 500.0000 mg | ORAL_TABLET | Freq: Two times a day (BID) | ORAL | 3 refills | Status: AC

## 2023-12-04 MED ORDER — FERROUS SULFATE 325 (65 FE) MG PO TABS
325.0000 mg | ORAL_TABLET | Freq: Every day | ORAL | 1 refills | Status: AC
Start: 2023-12-04 — End: ?

## 2023-12-04 MED ORDER — LEVOTHYROXINE SODIUM 125 MCG PO TABS: 125.0000 ug | ORAL_TABLET | Freq: Every day | ORAL | 3 refills | Status: AC

## 2024-03-29 ENCOUNTER — Ambulatory Visit: Payer: Self-pay | Admitting: Family Medicine

## 2024-04-02 ENCOUNTER — Ambulatory Visit: Payer: Self-pay | Admitting: Family Medicine

## 2024-04-09 ENCOUNTER — Encounter: Payer: Self-pay | Admitting: Family Medicine

## 2024-04-30 ENCOUNTER — Encounter: Payer: Self-pay | Admitting: Radiology
